# Patient Record
Sex: Male | Born: 1950 | Race: Black or African American | Hispanic: No | Smoking: Never smoker
Health system: Southern US, Community
[De-identification: ages and names within clinical notes are randomized; demographics above are authoritative.]

## PROBLEM LIST (undated history)

## (undated) DIAGNOSIS — E669 Obesity, unspecified: Secondary | ICD-10-CM

## (undated) DIAGNOSIS — C61 Malignant neoplasm of prostate: Secondary | ICD-10-CM

## (undated) DIAGNOSIS — I10 Essential (primary) hypertension: Secondary | ICD-10-CM

## (undated) HISTORY — DX: Obesity, unspecified: E66.9

## (undated) HISTORY — DX: Essential (primary) hypertension: I10

---

## 2012-01-16 ENCOUNTER — Other Ambulatory Visit: Payer: Self-pay | Admitting: Physician Assistant

## 2012-01-25 ENCOUNTER — Ambulatory Visit: Payer: Self-pay | Admitting: Physician Assistant

## 2012-01-25 DIAGNOSIS — E669 Obesity, unspecified: Secondary | ICD-10-CM

## 2012-01-25 DIAGNOSIS — R972 Elevated prostate specific antigen [PSA]: Secondary | ICD-10-CM

## 2012-01-25 DIAGNOSIS — E119 Type 2 diabetes mellitus without complications: Secondary | ICD-10-CM

## 2012-01-25 DIAGNOSIS — I10 Essential (primary) hypertension: Secondary | ICD-10-CM

## 2012-01-25 LAB — GLUCOSE, POCT (MANUAL RESULT ENTRY): POC Glucose: 187

## 2012-01-25 MED ORDER — METFORMIN HCL 1000 MG PO TABS: 1000.0000 mg | ORAL_TABLET | Freq: Two times a day (BID) | ORAL | Status: DC

## 2012-01-25 MED ORDER — FREESTYLE SYSTEM KIT: 1.0000 | PACK | Status: AC | PRN

## 2012-01-25 MED ORDER — GLIPIZIDE 10 MG PO TABS: 10.0000 mg | ORAL_TABLET | Freq: Two times a day (BID) | ORAL | Status: DC

## 2012-01-25 MED ORDER — GLUCOSE BLOOD VI STRP: ORAL_STRIP | Status: AC

## 2012-01-25 MED ORDER — LISINOPRIL 20 MG PO TABS: 20.0000 mg | ORAL_TABLET | Freq: Every day | ORAL | Status: DC

## 2012-01-25 MED ORDER — AMITRIPTYLINE HCL 100 MG PO TABS: 100.0000 mg | ORAL_TABLET | Freq: Every evening | ORAL | Status: DC | PRN

## 2012-01-25 NOTE — Progress Notes (Signed)
  Subjective:    Patient ID: Jerome Serrano, male    DOB: [birth year], 61 y.o.   MRN: [id number]  HPI Pt. Has seen a counsellor since last seeing me.  Taking amitryptiline helped him deal with life and depression.  Has worked a job with his wife until January 2013.  Now new job.  Feels now more at peace and more self-esteem from having a job separate from her. Was getting a lot of relief from foot pain with the amitryptilne.Current job has exercise facility.  Needs note to participate. Pt currently only taking 1 dose of metformin daily, and has been taking glucotrol 1x daily. With new job, he'll be getting insurance soon and is electing to address other health issues once he gets that started.   Review of Systems  All other systems reviewed and are negative.       Objective:   Physical Exam  Nursing note and vitals reviewed. Constitutional: He is oriented to person, place, and time. He appears well-developed and well-nourished.       obese  HENT:  Head: Normocephalic and atraumatic.  Neck: Normal range of motion. Neck supple. No thyromegaly present.  Cardiovascular: Normal rate, regular rhythm, normal heart sounds and intact distal pulses.  Exam reveals no gallop and no friction rub.   No murmur heard. Pulmonary/Chest: Effort normal and breath sounds normal.  Lymphadenopathy:    He has no cervical adenopathy.  Neurological: He is alert and oriented to person, place, and time.  Skin: Skin is warm and dry.  Psychiatric: He has a normal mood and affect. His behavior is normal. Judgment and thought content normal.      Results for orders placed in visit on 01/25/12  GLUCOSE, POCT (MANUAL RESULT ENTRY)      Component Value Range   POC Glucose 187    POCT GLYCOSYLATED HEMOGLOBIN (HGB A1C)      Component Value Range   Hemoglobin A1C 8.3         Assessment & Plan:  Anxiety/Depression-Continue elavil.  Also it has eradicated the foot pain he was having.  Diabetes-uncontrolled.  Dose changes on meds.  Rx new glucometer. Hypertension-uncontrolled for diabetic goal.  Increase Lisinopril Obesity-start gentle increase in physical activity as tolerated.

## 2012-01-26 LAB — PSA: PSA: 5.05 ng/mL — ABNORMAL HIGH (ref <=4.00)

## 2012-01-27 ENCOUNTER — Telehealth: Payer: Self-pay

## 2012-01-27 NOTE — Telephone Encounter (Signed)
Pt has questions about his medications at the [facility] [address] he only received his blood sugar meter he understood that there would be more prescriptions  Thanks Lupita Leash

## 2012-01-29 NOTE — Telephone Encounter (Signed)
LMOM to CB. 

## 2012-01-31 DIAGNOSIS — E119 Type 2 diabetes mellitus without complications: Secondary | ICD-10-CM | POA: Insufficient documentation

## 2012-01-31 DIAGNOSIS — I10 Essential (primary) hypertension: Secondary | ICD-10-CM | POA: Insufficient documentation

## 2012-01-31 DIAGNOSIS — E669 Obesity, unspecified: Secondary | ICD-10-CM | POA: Insufficient documentation

## 2012-01-31 NOTE — Telephone Encounter (Signed)
LMOM to CB if he still has ?s about his meds that were sent to pharmacy, or if pharmacy maybe found the other Rxs and problem had been resolved?

## 2012-01-31 NOTE — Progress Notes (Signed)
Addended by: Anders Simmonds on: 01/31/2012 05:19 PM   Modules accepted: Orders

## 2012-01-31 NOTE — Telephone Encounter (Signed)
SPoke with pt and he is going to check if the pharmacy has RXS now

## 2012-02-01 NOTE — Telephone Encounter (Signed)
Spoke with patient patient states that pharmacy has prescriptions.  Patient was not able to pick up meter because it is $134 and patient can not afford.  Patient would like a cheaper alternative...possibly [facility]. Please let patient know.

## 2012-02-01 NOTE — Telephone Encounter (Signed)
Called [facility] and spoke with pharmacy to give them OK to change meter/testing strips to brand of pt's choice. Called pt to notify him that he can go by and pharmacy will discuss different options and costs, and they have our authorization to change to meter of his choice. Pt agreed

## 2012-02-21 ENCOUNTER — Telehealth: Payer: Self-pay

## 2012-02-21 MED ORDER — AMITRIPTYLINE HCL 100 MG PO TABS: 100.0000 mg | ORAL_TABLET | Freq: Every evening | ORAL | Status: DC | PRN

## 2012-02-21 NOTE — Telephone Encounter (Signed)
amitriptyline

## 2012-02-21 NOTE — Telephone Encounter (Signed)
Pt states pharmacy does not have a rx for his amtriptomian and he has tried to get pharmacy to refill pt last seen by [employer]

## 2012-02-21 NOTE — Telephone Encounter (Signed)
Pt wants to know if we can do a 90d supply

## 2012-02-21 NOTE — Telephone Encounter (Signed)
Done and sent.  Jerome Serrano 

## 2012-02-22 MED ORDER — AMITRIPTYLINE HCL 100 MG PO TABS: 100.0000 mg | ORAL_TABLET | Freq: Every evening | ORAL | Status: DC | PRN

## 2012-02-22 NOTE — Telephone Encounter (Signed)
Rx sent to pharmacy for this patient yesterday stating he needs an OV, but he was just here 3/6.  Can we rx 90 day supply with refill?

## 2012-02-22 NOTE — Telephone Encounter (Signed)
Sent in a 90 supply.

## 2012-02-22 NOTE — Telephone Encounter (Signed)
Pt states that he has not had his medication for amitriptyline for 6 days. He states that the people around him are starting to notice that he has be extra grouchy.

## 2012-02-22 NOTE — Telephone Encounter (Signed)
Notified pt 90 day RF sent in. Pt thanked [place]

## 2012-05-18 ENCOUNTER — Other Ambulatory Visit: Payer: Self-pay | Admitting: Physician Assistant

## 2012-05-30 ENCOUNTER — Ambulatory Visit: Payer: Self-pay | Admitting: Physician Assistant

## 2012-06-08 ENCOUNTER — Encounter: Payer: Self-pay | Admitting: Physician Assistant

## 2012-06-08 ENCOUNTER — Ambulatory Visit (INDEPENDENT_AMBULATORY_CARE_PROVIDER_SITE_OTHER): Payer: BC Managed Care – PPO | Admitting: Physician Assistant

## 2012-06-08 VITALS — BP 175/96 | HR 88 | Temp 97.6°F | Resp 16 | Ht 67.0 in | Wt 293.0 lb

## 2012-06-08 DIAGNOSIS — E669 Obesity, unspecified: Secondary | ICD-10-CM

## 2012-06-08 DIAGNOSIS — E7800 Pure hypercholesterolemia, unspecified: Secondary | ICD-10-CM

## 2012-06-08 DIAGNOSIS — E119 Type 2 diabetes mellitus without complications: Secondary | ICD-10-CM

## 2012-06-08 DIAGNOSIS — I10 Essential (primary) hypertension: Secondary | ICD-10-CM

## 2012-06-08 LAB — COMPREHENSIVE METABOLIC PANEL
ALT: 54 U/L — ABNORMAL HIGH (ref 0–53)
AST: 34 U/L (ref 0–37)
Albumin: 4.1 g/dL (ref 3.5–5.2)
Alkaline Phosphatase: 89 U/L (ref 39–117)
BUN: 10 mg/dL (ref 6–23)
Potassium: 4 mEq/L (ref 3.5–5.3)

## 2012-06-08 LAB — LIPID PANEL
Cholesterol: 184 mg/dL (ref 0–200)
LDL Cholesterol: 109 mg/dL — ABNORMAL HIGH (ref 0–99)
Total CHOL/HDL Ratio: 3.1 Ratio
VLDL: 16 mg/dL (ref 0–40)

## 2012-06-08 LAB — CBC WITH DIFFERENTIAL/PLATELET
Eosinophils Absolute: 0.1 10*3/uL (ref 0.0–0.7)
Eosinophils Relative: 1 % (ref 0–5)
HCT: 44.1 % (ref 39.0–52.0)
Hemoglobin: 15 g/dL (ref 13.0–17.0)
Lymphs Abs: 1.6 10*3/uL (ref 0.7–4.0)
MCH: 28.3 pg (ref 26.0–34.0)
MCHC: 34 g/dL (ref 30.0–36.0)
MCV: 83.2 fL (ref 78.0–100.0)
Monocytes Absolute: 0.4 10*3/uL (ref 0.1–1.0)
Monocytes Relative: 7 % (ref 3–12)
Neutrophils Relative %: 67 % (ref 43–77)
RBC: 5.3 MIL/uL (ref 4.22–5.81)

## 2012-06-08 MED ORDER — GLIPIZIDE 10 MG PO TABS: 10.0000 mg | ORAL_TABLET | Freq: Two times a day (BID) | ORAL | Status: DC

## 2012-06-08 MED ORDER — AMITRIPTYLINE HCL 100 MG PO TABS: 100.0000 mg | ORAL_TABLET | Freq: Every evening | ORAL | Status: DC | PRN

## 2012-06-08 MED ORDER — LISINOPRIL-HYDROCHLOROTHIAZIDE 20-12.5 MG PO TABS: 1.0000 | ORAL_TABLET | Freq: Every day | ORAL | Status: DC

## 2012-06-08 MED ORDER — METFORMIN HCL 1000 MG PO TABS: 1000.0000 mg | ORAL_TABLET | Freq: Two times a day (BID) | ORAL | Status: DC

## 2012-06-08 NOTE — Patient Instructions (Addendum)
Check blood pressure 3-4 times per week and record to bring to me in 1 month at f/up Exercise at least 5 minutes everyday. Drink more water. Cut out sugar and white carbohydrates.

## 2012-06-08 NOTE — Progress Notes (Signed)
  Subjective:    Patient ID: Jerome Serrano, male    DOB: 06-04-1951, 61 y.o.   MRN: [id number]  HPI Jerome Serrano is here today for a diabetes and blood pressure check up.  He admits to poor diet including bakery goods  Last night he ate very salty [place] food.  His BP out of office run ~150/80-90. His feet are no longer causing him problems since starting the elavil. His sugars are running in the low 200s.  He says he is compliant with his medications.  He is scheduled for a prostate biopsy in September.  Review of Systems  All other systems reviewed and are negative.       Objective:   Physical Exam  Nursing note and vitals reviewed. Constitutional: He is oriented to person, place, and time. He appears well-developed and well-nourished.  Cardiovascular: Normal rate, regular rhythm, normal heart sounds and intact distal pulses.   Pulmonary/Chest: Effort normal and breath sounds normal.  Musculoskeletal:       B feet without sores and sensory is intact.  Neurological: He is alert and oriented to person, place, and time.  Skin: Skin is warm and dry.   Results for orders placed in visit on 06/08/12  POCT GLYCOSYLATED HEMOGLOBIN (HGB A1C)      Component Value Range   Hemoglobin A1C 9.1          Assessment & Plan:  Hypertension-not controlled.  Change lisinopril to lisinopril/HCT.  Decrease salt intake.   Diabetes-uncontrolled.  A1C worse at this visit, but patient is insistent that he will give up sugar and begin exercising and he will not discuss insulin at this point.  Keep meds same for now. Paresthesias-feet-improved on elavil. Continue elavil. Sending to nutrition. Discussed lifestyle and nutrition choices at length.  Patient has agreed to the instructions I printed for him and is to return in 1 month.  If not making progress, will discuss insulin.

## 2012-06-21 ENCOUNTER — Encounter: Payer: BC Managed Care – PPO | Attending: Physician Assistant | Admitting: *Deleted

## 2012-06-21 ENCOUNTER — Encounter: Payer: Self-pay | Admitting: *Deleted

## 2012-06-21 DIAGNOSIS — E119 Type 2 diabetes mellitus without complications: Secondary | ICD-10-CM | POA: Insufficient documentation

## 2012-06-21 DIAGNOSIS — Z713 Dietary counseling and surveillance: Secondary | ICD-10-CM | POA: Insufficient documentation

## 2012-06-21 NOTE — Progress Notes (Signed)
  Medical Nutrition Therapy:  Appt start time: 1630 end time:  1730.  Assessment:  Primary concerns today: patient here for diabetes education and assistance with obesity. He reads an e-mail newsletter "Diabetes Today" every day.  He states history of diabetes for about 20 years, tests his BG twice a day or more, and received diabetes education about 10 years ago.  Current A1c = 9.1% on 06/08/2012 MEDICATIONS: see list. Diabetes medication include Metformin and Glipizide   DIETARY INTAKE:  Usual eating pattern includes 3 meals and 0-1 snacks per day.  Everyday foods include good variety of all food grous.  Avoided foods include sweets and high fat foods.    24-hr recall:  B ( AM): just resumed eating breakfast: 8 oz non-fat fruited yogurt with granola, coffee in winter time only Still skips a couple of days a week. Snk ( AM): none  L ( PM): 1/2 of a large salad OR grilled chicken with cole slaw, OR left overs, SF lemonade or water Snk ( PM): none D ( PM): lean meat, starch, vegetables or salad,  Snk ( PM): animal crackers x 2-3 Beverages: water or SF lemonade  Usual physical activity: taking stairs more often  Estimated energy needs: 1600 calories 180 g carbohydrates 120 g protein 44 g fat  Progress Towards Goal(s):  In progress.   Nutritional Diagnosis:  NB-1.1 Food and nutrition-related knowledge deficit As related to diabetes management.  As evidenced by A1c of 9.1% on 06/08/12.    Intervention:  Nutrition counseling and diabetes education initiated. Discussed basic physiology of diabetes, SMBG and rationale of checking BG at alternate times of day, A1c, Carb Counting and reading food labels, and benefits of increased activity.   Plan: Aim for 3-4 Carb Choices (45-60 grams) per meal, 0-2 Carbs per snack if hungry Read food labels for total carbohydrate of foods eaten Consider APP for phone called [employer] to obtain nutrient content of foods easily Continue checking BG as  directed by MD, continue checking pre and post meals Consider being more active for 10-15 minutes on a daily basis  Handouts given during visit include: Living Well with Diabetes Carb Counting and Food Label handouts Meal Plan Card  Monitoring/Evaluation:  Dietary intake, exercise, reading food labels, and body weight prn.

## 2012-06-29 ENCOUNTER — Encounter: Payer: Self-pay | Admitting: *Deleted

## 2012-06-29 NOTE — Patient Instructions (Signed)
Plan: Aim for 3-4 Carb Choices (45-60 grams) per meal, 0-2 Carbs per snack if hungry Read food labels for total carbohydrate of foods eaten Consider APP for phone called [employer] to obtain nutrient content of foods easily Continue checking BG as directed by MD, continue checking pre and post meals Consider being more active for 10-15 minutes on a daily basis

## 2012-07-04 ENCOUNTER — Ambulatory Visit (INDEPENDENT_AMBULATORY_CARE_PROVIDER_SITE_OTHER): Payer: BC Managed Care – PPO | Admitting: Physician Assistant

## 2012-07-04 VITALS — BP 162/97 | HR 95 | Temp 97.0°F | Resp 16 | Ht 66.5 in | Wt 285.0 lb

## 2012-07-04 DIAGNOSIS — I10 Essential (primary) hypertension: Secondary | ICD-10-CM

## 2012-07-04 DIAGNOSIS — E669 Obesity, unspecified: Secondary | ICD-10-CM

## 2012-07-04 DIAGNOSIS — E119 Type 2 diabetes mellitus without complications: Secondary | ICD-10-CM

## 2012-07-04 LAB — GLUCOSE, POCT (MANUAL RESULT ENTRY): POC Glucose: 192 mg/dl — AB (ref 70–99)

## 2012-07-04 LAB — BASIC METABOLIC PANEL
CO2: 26 mEq/L (ref 19–32)
Calcium: 9.3 mg/dL (ref 8.4–10.5)
Creat: 1.05 mg/dL (ref 0.50–1.35)
Glucose, Bld: 179 mg/dL — ABNORMAL HIGH (ref 70–99)

## 2012-07-04 MED ORDER — HYDROCHLOROTHIAZIDE 25 MG PO TABS: 25.0000 mg | ORAL_TABLET | Freq: Every day | ORAL | Status: DC

## 2012-07-04 MED ORDER — LISINOPRIL 40 MG PO TABS: 40.0000 mg | ORAL_TABLET | Freq: Every day | ORAL | Status: AC

## 2012-07-04 NOTE — Progress Notes (Signed)
  Subjective:    Patient ID: Jerome Serrano, male    DOB: Apr 23, 1951, 61 y.o.   MRN: [id number]  HPI patient here for a recheck of his diabetes and hypertension.  He brought his BP cuff with him and it is in line with the numbers we are getting in office. However, as we scrolled through his BPs over the last month, they have averaged ~140/90 outside of the office!!  He went to the nutritionist and has been doing 5 minutes of exercise daily.  He has lost 8 pounds in 1 month!  Review of Systems  All other systems reviewed and are negative.       Objective:   Physical Exam  Nursing note and vitals reviewed. Constitutional: He is oriented to person, place, and time. He appears well-developed and well-nourished.  HENT:  Head: Normocephalic and atraumatic.  Cardiovascular: Normal rate, regular rhythm, normal heart sounds and intact distal pulses.   Pulmonary/Chest: Effort normal and breath sounds normal.  Musculoskeletal: He exhibits no edema.  Neurological: He is alert and oriented to person, place, and time.  Skin: Skin is warm and dry.     Results for orders placed in visit on 07/04/12  GLUCOSE, POCT (MANUAL RESULT ENTRY)      Component Value Range   POC Glucose 192 (*) 70 - 99 mg/dl        Assessment & Plan:  Diabetes-improving.  Patient is making progress and is determined to not take insulin, so as long as he is making progress and his fasting BG is coming down, we will hold off.  He is going to fax me his morning fasting BS weekly. Hypertension-not controlled with the addition of HCTZ.  Increasing Lisinopril to 40mg  and HCTZ to 25mg .  1 daily of each.  Recheck in 2 months and bring cuff so I can review BP numbers. I do think he has a component of white coat htn as his outside numbers are much more reasonable.

## 2012-07-05 ENCOUNTER — Encounter: Payer: Self-pay | Admitting: Physician Assistant

## 2012-07-20 ENCOUNTER — Other Ambulatory Visit: Payer: Self-pay | Admitting: Physician Assistant

## 2012-07-21 ENCOUNTER — Telehealth: Payer: Self-pay

## 2012-07-21 NOTE — Telephone Encounter (Signed)
PT WOULD LIKE TO KNOW IF THE HCTZ THAT HE WAS PRESCRIBED IS SUPPOSED TO REPLACE THE CURRENT HIGH BLOOD PRESSURE MEDICATION THAT HE IS ON, OR IS HE SUPPOSE TO TAKE THEM TOGETHER. BEST# [phone]

## 2012-07-21 NOTE — Telephone Encounter (Signed)
LMOM advising patient that he is supposed to continue the lisinopril and the hctz daily for his htn.  CB if ?'s.

## 2012-08-20 ENCOUNTER — Other Ambulatory Visit: Payer: Self-pay | Admitting: Physician Assistant

## 2012-10-26 ENCOUNTER — Other Ambulatory Visit: Payer: Self-pay | Admitting: Physician Assistant

## 2012-11-02 ENCOUNTER — Telehealth: Payer: Self-pay

## 2012-11-02 NOTE — Telephone Encounter (Signed)
Will forward to [place]. As I do not see that he was seen here recently.

## 2012-11-02 NOTE — Telephone Encounter (Signed)
Jerome Serrano: Patient calling saying he needs a work note for December 2 - December 6 for work because he was out the entire week. Please call him at [phone]

## 2012-11-06 NOTE — Telephone Encounter (Signed)
Tell him I am sorry!  I am unable to do this because I can't document medical necessity since he wasn't seen in the office and evaluated.

## 2012-11-07 NOTE — Telephone Encounter (Signed)
Called patient to advise  °

## 2012-12-04 ENCOUNTER — Telehealth: Payer: Self-pay

## 2012-12-04 MED ORDER — METFORMIN HCL 1000 MG PO TABS: 1000.0000 mg | ORAL_TABLET | Freq: Two times a day (BID) | ORAL | Status: DC

## 2012-12-04 MED ORDER — GLIPIZIDE 10 MG PO TABS: 10.0000 mg | ORAL_TABLET | Freq: Two times a day (BID) | ORAL | Status: DC

## 2012-12-04 NOTE — Telephone Encounter (Signed)
Sent in 1 mo supply patient advised. He is advised he needs to follow up before these run out.

## 2012-12-04 NOTE — Telephone Encounter (Signed)
PT STATES THE PHARMACY HAVE BEEN TRYING TO REFILL HIS METFORMIN AND GLUCOTROL AND THEY HAVEN'T HEARD FROM ANYONE, IS IN DIRE NEED OF HIS MEDS PLEASE CALL [phone] OR HIS CELL AT [phone]     [facility]

## 2013-01-09 ENCOUNTER — Encounter: Payer: Self-pay | Admitting: Physician Assistant

## 2013-01-09 ENCOUNTER — Ambulatory Visit (INDEPENDENT_AMBULATORY_CARE_PROVIDER_SITE_OTHER): Payer: BC Managed Care – PPO | Admitting: Physician Assistant

## 2013-01-09 ENCOUNTER — Telehealth: Payer: Self-pay

## 2013-01-09 VITALS — BP 146/90 | HR 97 | Temp 98.5°F | Resp 16 | Ht 67.0 in | Wt 277.6 lb

## 2013-01-09 DIAGNOSIS — E785 Hyperlipidemia, unspecified: Secondary | ICD-10-CM

## 2013-01-09 DIAGNOSIS — E1165 Type 2 diabetes mellitus with hyperglycemia: Secondary | ICD-10-CM

## 2013-01-09 LAB — LIPID PANEL
HDL: 67 mg/dL (ref >39)
Total CHOL/HDL Ratio: 3.7 Ratio
Triglycerides: 193 mg/dL — ABNORMAL HIGH (ref <150)

## 2013-01-09 LAB — COMPREHENSIVE METABOLIC PANEL
Albumin: 4 g/dL (ref 3.5–5.2)
CO2: 26 mEq/L (ref 19–32)
Glucose, Bld: 294 mg/dL — ABNORMAL HIGH (ref 70–99)
Potassium: 4.1 mEq/L (ref 3.5–5.3)
Sodium: 134 mEq/L — ABNORMAL LOW (ref 135–145)
Total Protein: 7.4 g/dL (ref 6.0–8.3)

## 2013-01-09 MED ORDER — AMITRIPTYLINE HCL 100 MG PO TABS: 100.0000 mg | ORAL_TABLET | Freq: Every evening | ORAL | Status: DC | PRN

## 2013-01-09 MED ORDER — INSULIN GLARGINE 100 UNIT/ML ~~LOC~~ SOLN
15.0000 [IU] | Freq: Every day | SUBCUTANEOUS | Status: DC
Start: 2013-01-09 — End: 2013-01-11

## 2013-01-09 MED ORDER — METFORMIN HCL 1000 MG PO TABS: 1000.0000 mg | ORAL_TABLET | Freq: Two times a day (BID) | ORAL | Status: DC

## 2013-01-09 MED ORDER — GLUCOSE BLOOD VI STRP: ORAL_STRIP | Status: DC

## 2013-01-09 MED ORDER — GLIPIZIDE 10 MG PO TABS: 10.0000 mg | ORAL_TABLET | Freq: Two times a day (BID) | ORAL | Status: DC

## 2013-01-09 MED ORDER — HYDROCHLOROTHIAZIDE 25 MG PO TABS: 25.0000 mg | ORAL_TABLET | Freq: Every day | ORAL | Status: DC

## 2013-01-09 NOTE — Telephone Encounter (Signed)
Did you want him to have a lantus pen, pended this please advise so we can resend to pharmacy

## 2013-01-09 NOTE — Telephone Encounter (Signed)
Pt states that him and [place] discussed him getting an insulin pen today during his appt, pt is currently at the pharmacy and he states that they gave him a vial of insulin. ZOXW#[phone]  Pharmacy:[facility]

## 2013-01-09 NOTE — Progress Notes (Signed)
  Subjective:    Patient ID: Jerome Serrano, male    DOB: 06/20/1951, 62 y.o.   MRN: [id number]  HPI 62 yr old AAM here for diabetes/htn recheck.  He was supposed to see me in September. He has bee +/- on his dietary changes since seeing the nutritionist in 06/2012.  He has lost 8 pounds since his last visit with me.  His sugars have been ranging from usu ~160, but he has had 1 reading that was 400 recently.  He says he is compliant with his meds about 85-90% of the time. As far as injectable diabetes meds, he was on byetta at one point, so he is familiar with injections.  He still has not rescheduled his prostate biopsy!!!! Review of Systems  All other systems reviewed and are negative.       Objective:   Physical Exam  Nursing note and vitals reviewed. Constitutional: He is oriented to person, place, and time. He appears well-developed and well-nourished.  HENT:  Head: Normocephalic and atraumatic.  Cardiovascular: Normal rate, regular rhythm and normal heart sounds.   Pulmonary/Chest: Effort normal and breath sounds normal.  Neurological: He is alert and oriented to person, place, and time.  Skin: Skin is warm and dry.  Psychiatric: He has a normal mood and affect. His behavior is normal.     Results for orders placed in visit on 01/09/13  POCT GLYCOSYLATED HEMOGLOBIN (HGB A1C)      Result Value Range   Hemoglobin A1C 11.4          Assessment & Plan:  Diabetes-not controlled.  His last A1C was ~9 in august 2013.  We again discussed continued, more aggressive lifestyle changes.  In the mean time, I am going to have him go ahead and see an endocrinologist to help with diabetes management.  Until we can get that appointment, I am starting him on 15 units of lantus at night and he will continue his current meds and watch diet more closely. Hypertension-sub optimal control, but much better than last visit and will continue to improve with weight loss.  No changes for  now. Hypercholesterolemia-checking labs. Elevated PSA-we referred him and he saw the urologist and they scheduled him for a biopsy, but he didn't keep the appointment.  He assures me again that he will reschedule this.  I told him it is imperative and discussed the R/B and possible repercussions if he continues to put it off.

## 2013-01-10 LAB — CBC WITH DIFFERENTIAL/PLATELET
Basophils Absolute: 0 10*3/uL (ref 0.0–0.1)
Eosinophils Absolute: 0.1 10*3/uL (ref 0.0–0.7)
Lymphocytes Relative: 30 % (ref 12–46)
Lymphs Abs: 1.8 10*3/uL (ref 0.7–4.0)
MCH: 28 pg (ref 26.0–34.0)
Neutrophils Relative %: 63 % (ref 43–77)
Platelets: 260 10*3/uL (ref 150–400)
RBC: 5.33 MIL/uL (ref 4.22–5.81)
WBC: 5.9 10*3/uL (ref 4.0–10.5)

## 2013-01-10 LAB — MICROALBUMIN, URINE: Microalb, Ur: 0.9 mg/dL (ref 0.00–1.89)

## 2013-01-11 MED ORDER — INSULIN PEN NEEDLE 30G X 8 MM MISC: Status: DC

## 2013-01-11 MED ORDER — INSULIN GLARGINE 100 UNIT/ML ~~LOC~~ SOLN: 15.0000 [IU] | Freq: Every day | SUBCUTANEOUS | Status: DC

## 2013-01-11 NOTE — Telephone Encounter (Signed)
Sent in the pen and pen needles. Called patient to advise

## 2013-01-11 NOTE — Telephone Encounter (Signed)
It should be the pen

## 2013-01-13 ENCOUNTER — Telehealth: Payer: Self-pay

## 2013-01-13 NOTE — Telephone Encounter (Signed)
Pt doesn't know who just called but would like for someone to call him back

## 2013-01-14 NOTE — Telephone Encounter (Signed)
Jerome Serrano spoke with him about his labs on that day.

## 2013-11-11 ENCOUNTER — Other Ambulatory Visit: Payer: Self-pay | Admitting: Physician Assistant

## 2013-12-10 ENCOUNTER — Institutional Professional Consult (permissible substitution): Payer: BC Managed Care – PPO | Admitting: Radiation Oncology

## 2014-02-07 ENCOUNTER — Other Ambulatory Visit: Payer: Self-pay | Admitting: Physician Assistant

## 2014-04-18 ENCOUNTER — Other Ambulatory Visit: Payer: Self-pay | Admitting: Family Medicine

## 2014-04-18 ENCOUNTER — Ambulatory Visit
Admission: RE | Admit: 2014-04-18 | Discharge: 2014-04-18 | Disposition: A | Discharge status: Home / Self Care | Payer: BC Managed Care – PPO | Source: Ambulatory Visit | Attending: Family Medicine | Admitting: Family Medicine

## 2014-04-18 DIAGNOSIS — R52 Pain, unspecified: Secondary | ICD-10-CM

## 2014-05-17 ENCOUNTER — Other Ambulatory Visit: Payer: Self-pay | Admitting: Physician Assistant

## 2014-10-06 ENCOUNTER — Encounter: Payer: Self-pay | Admitting: Radiation Oncology

## 2014-10-06 NOTE — Progress Notes (Signed)
GU Location of Tumor / Histology: prostate cancer    Jerome Serrano presented with rising PSA.    PSA history:    Biopsies revealed:  09/16/14   Past/Anticipated interventions by urology, if any: prostate biopsy 10/27  Past/Anticipated interventions by medical oncology, if any:   Weight changes, if any:   Bowel/Bladder complaints, if any:    Nausea/Vomiting, if any:   Pain issues, if any:    SAFETY ISSUES:  Prior radiation?   Pacemaker/ICD?   Possible current pregnancy? no  Is the patient on methotrexate? no  Current Complaints / other details:

## 2014-10-08 ENCOUNTER — Ambulatory Visit: Payer: BC Managed Care – PPO

## 2014-10-08 ENCOUNTER — Ambulatory Visit
Admission: RE | Admit: 2014-10-08 | Discharge: 2014-10-08 | Disposition: A | Discharge status: Home / Self Care | Payer: BC Managed Care – PPO | Source: Ambulatory Visit | Attending: Radiation Oncology | Admitting: Radiation Oncology

## 2014-10-08 HISTORY — DX: Malignant neoplasm of prostate: C61

## 2014-10-21 HISTORY — PX: PROSTATE BIOPSY: SHX241

## 2014-10-29 ENCOUNTER — Ambulatory Visit
Admission: RE | Admit: 2014-10-29 | Discharge: 2014-10-29 | Disposition: A | Discharge status: Home / Self Care | Payer: BC Managed Care – PPO | Source: Ambulatory Visit | Attending: Radiation Oncology | Admitting: Radiation Oncology

## 2014-10-29 ENCOUNTER — Ambulatory Visit: Payer: BC Managed Care – PPO

## 2014-12-08 NOTE — Progress Notes (Addendum)
GU Location of Tumor / Histology: prostate cancer    Darolyn Rua presented with rising PSA.   PSA history:    Biopsies revealed:  09/16/14   Past/Anticipated interventions by urology, if any: prostate biopsy 10/27  Past/Anticipated interventions by medical oncology, if any: no  Weight changes, if any: has lost 7 lbs due to taking Victoza.  Bowel/Bladder complaints, if any: Reports occasionally diarrhea from changing diabetes medications.  IPSS score of 1 with getting up once per night to urinate.  Nausea/Vomiting, if any: no  Pain issues, if any: no  SAFETY ISSUES:  Prior radiation? no  Pacemaker/ICD? no  Possible current pregnancy? no  Is the patient on methotrexate? no  Current Complaints / other details: IPSS score of 1.        BP 157/74 mmHg  Pulse 71  Temp(Src) 97.7 F (36.5 C) (Oral)  Resp 12  Ht 5' 8.5" (1.74 m)  Wt 286 lb 1.6 oz (129.774 kg)  BMI 42.86 kg/m2

## 2014-12-10 ENCOUNTER — Encounter: Payer: Self-pay | Admitting: Radiation Oncology

## 2014-12-10 ENCOUNTER — Ambulatory Visit
Admission: RE | Admit: 2014-12-10 | Discharge: 2014-12-10 | Disposition: A | Discharge status: Home / Self Care | Payer: BLUE CROSS/BLUE SHIELD | Source: Ambulatory Visit | Attending: Radiation Oncology | Admitting: Radiation Oncology

## 2014-12-10 VITALS — BP 157/74 | HR 71 | Temp 97.7°F | Resp 12 | Ht 68.5 in | Wt 286.1 lb

## 2014-12-10 DIAGNOSIS — I10 Essential (primary) hypertension: Secondary | ICD-10-CM | POA: Insufficient documentation

## 2014-12-10 DIAGNOSIS — E119 Type 2 diabetes mellitus without complications: Secondary | ICD-10-CM | POA: Diagnosis not present

## 2014-12-10 DIAGNOSIS — Z7982 Long term (current) use of aspirin: Secondary | ICD-10-CM | POA: Diagnosis not present

## 2014-12-10 DIAGNOSIS — E669 Obesity, unspecified: Secondary | ICD-10-CM | POA: Insufficient documentation

## 2014-12-10 DIAGNOSIS — Z794 Long term (current) use of insulin: Secondary | ICD-10-CM | POA: Diagnosis not present

## 2014-12-10 DIAGNOSIS — C61 Malignant neoplasm of prostate: Secondary | ICD-10-CM

## 2014-12-10 NOTE — Progress Notes (Signed)
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 male who is seen out courtesy of Dr. Louis Meckel for an opinion concerning radiation therapy for management of patient's recently diagnosed early stage prostate cancer. Patient in past years has been noted to have a mildly elevated PSA. In March 2013 the patient's PSA was 5.05. It was recommended the he proceed with biopsy but the patient deferred. He was seen again in the fall of 2015 and PSA had risen further to 7.10. Patient decided to proceed with further evaluation. He was seen by Dr. Louis Meckel and on 09/16/2014 the patient underwent transrectal ultrasound and biopsy of the prostate. Prostate volume at that time was 29.5 cm. 5 of 12 biopsies revealed Gleason's 6 prostate cancer (3+3). Low volume disease noted in the biopsies  with the highest involvement in the right mid gland being 20% of one core. The options for management were discussed in detail with the patient by urology. The patient is interested in discussing his radiation therapy options and is now seen concerning this issue.Marland Kitchen  PREVIOUS RADIATION THERAPY: No  PAST MEDICAL HISTORY:  has a past medical history of Diabetes mellitus; Hypertension; Obesity; and Prostate cancer.    PAST SURGICAL HISTORY: Past Surgical History  Procedure Laterality Date  . Prostate biopsy  12/15    FAMILY HISTORY: family history includes Breast cancer in his mother; Colon cancer in his father; Diabetes in his brother.  SOCIAL HISTORY:  reports that he has never smoked. He has never used smokeless tobacco. He reports that he does not drink alcohol.    ALLERGIES: Lactose intolerance (gi)  MEDICATIONS:  Current Outpatient Prescriptions  Medication Sig Dispense Refill  . Amlodipine-Olmesartan (AZOR PO) Take by mouth daily.    Marland Kitchen aspirin 81 MG tablet Take 81 mg by mouth daily.    . Cholecalciferol (VITAMIN D-3 PO) Take by mouth daily.    . Dulaglutide 1.5 MG/0.5ML SOPN Inject into the skin once a week.    . empagliflozin (JARDIANCE) 10 MG TABS tablet Take 10 mg by mouth daily.    Marland Kitchen glucose blood test strip Use as instructed 100 each 12  . Insulin Glargine (TOUJEO SOLOSTAR [place]) Inject 15 Units into the skin daily.    . Insulin Pen Needle (NOVOFINE) 30G X 8 MM MISC For use with lantus pens 100 each 1  . metFORMIN (GLUCOPHAGE) 1000 MG tablet Take 1 tablet (1,000 mg total) by mouth 2 (two) times daily with a meal. PATIENT NEEDS OFFICE VISIT FOR ADDITIONAL REFILLS (Patient taking differently: Take 1,000 mg by mouth daily with breakfast. PATIENT NEEDS OFFICE VISIT FOR ADDITIONAL REFILLS) 60 tablet 0  . LANTUS SOLOSTAR 100 UNIT/ML Solostar Pen INJECT 15 UNITS INTO THE SKIN AT BEDTIME (Patient not taking: Reported on 12/10/2014) 1 pen 0   No current facility-administered medications for this encounter.    REVIEW OF SYSTEMS:  A 15 point review of systems is documented in the electronic medical record. This was obtained by the nursing staff. However, I reviewed this with the patient to discuss relevant findings and make appropriate changes.  Patient denies any new bony pain headaches dizziness or blurred vision. He completed the international  prostate symptom score with total value of 2. The patient's only significant scores was nocturia. Patient does have erectile dysfunction which is marginally helped with Cialis. The patient admits to being quite anxious and upset concerning his diagnosis of cancer. He remembers that his mother died of metastatic breast cancer and his father died of complications related to colon cancer. Patient's brother also has a  diagnosis of prostate cancer.   PHYSICAL EXAM:  height is 5' 8.5" (1.74 m) and weight is 286 lb 1.6 oz (129.774 kg). His oral temperature is 97.7 F (36.5 C). His blood pressure is 157/74 and his pulse is 71. His respiration is 12.   A detailed examination was not performed today at the patient's request  ECOG = 0  0 - Asymptomatic (Fully active, able to carry on all predisease activities without restriction)  LABORATORY DATA:  Lab Results  Component Value Date   WBC 5.9 01/09/2013   HGB 14.9 01/09/2013   HCT 44.5 01/09/2013   MCV 83.5 01/09/2013   PLT 260 01/09/2013   NEUTROABS 3.7 01/09/2013   Lab Results  Component Value Date   NA 134* 01/09/2013   K 4.1 01/09/2013   CL 98 01/09/2013   CO2 26 01/09/2013   GLUCOSE 294* 01/09/2013   CREATININE 1.14 01/09/2013   CALCIUM 9.6 01/09/2013      RADIOGRAPHY: No results found.    IMPRESSION: Stage TIc Gleason's 6 (3+3) adenocarcinoma the prostate, PSA 7.1 with moderate volume disease. I discussed options for management including watchful waiting, radical prostatectomy seed implantation and external beam radiation therapy. I discussed the pros and cons of each of these treatment approaches. Given the patient's relatively young age and extent of disease involvement I was less hesitant in recommending watchful waiting. Patient is also not comfortable with this treatment approach.  He does wish to proceed with definitive/curative treatment, however the patient would like some time to make his decision.  PLAN: Once the patient has made his decision concerning definitive management he will contact urology or radiation oncology for initiation of this treatment.  He will call for questions in the meantime.      ------------------------------------------------  Blair Promise, PhD, MD

## 2014-12-10 NOTE — Progress Notes (Signed)
Please see the Nurse Progress Note in the MD Initial Consult Encounter for this patient. 

## 2016-10-18 DIAGNOSIS — C61 Malignant neoplasm of prostate: Secondary | ICD-10-CM | POA: Diagnosis not present

## 2016-10-18 DIAGNOSIS — I10 Essential (primary) hypertension: Secondary | ICD-10-CM | POA: Diagnosis not present

## 2016-10-18 DIAGNOSIS — E119 Type 2 diabetes mellitus without complications: Secondary | ICD-10-CM | POA: Diagnosis not present

## 2016-10-18 DIAGNOSIS — E6609 Other obesity due to excess calories: Secondary | ICD-10-CM | POA: Diagnosis not present

## 2016-11-08 DIAGNOSIS — R972 Elevated prostate specific antigen [PSA]: Secondary | ICD-10-CM | POA: Diagnosis not present

## 2016-11-08 DIAGNOSIS — E6609 Other obesity due to excess calories: Secondary | ICD-10-CM | POA: Diagnosis not present

## 2016-11-08 DIAGNOSIS — E119 Type 2 diabetes mellitus without complications: Secondary | ICD-10-CM | POA: Diagnosis not present

## 2016-11-08 DIAGNOSIS — I10 Essential (primary) hypertension: Secondary | ICD-10-CM | POA: Diagnosis not present

## 2016-12-09 DIAGNOSIS — E119 Type 2 diabetes mellitus without complications: Secondary | ICD-10-CM | POA: Diagnosis not present

## 2016-12-09 DIAGNOSIS — I10 Essential (primary) hypertension: Secondary | ICD-10-CM | POA: Diagnosis not present

## 2016-12-09 DIAGNOSIS — E6609 Other obesity due to excess calories: Secondary | ICD-10-CM | POA: Diagnosis not present

## 2016-12-09 DIAGNOSIS — C61 Malignant neoplasm of prostate: Secondary | ICD-10-CM | POA: Diagnosis not present

## 2017-02-06 DIAGNOSIS — E6609 Other obesity due to excess calories: Secondary | ICD-10-CM | POA: Diagnosis not present

## 2017-02-06 DIAGNOSIS — I10 Essential (primary) hypertension: Secondary | ICD-10-CM | POA: Diagnosis not present

## 2017-02-06 DIAGNOSIS — C61 Malignant neoplasm of prostate: Secondary | ICD-10-CM | POA: Diagnosis not present

## 2017-02-06 DIAGNOSIS — E119 Type 2 diabetes mellitus without complications: Secondary | ICD-10-CM | POA: Diagnosis not present

## 2017-03-20 DIAGNOSIS — E119 Type 2 diabetes mellitus without complications: Secondary | ICD-10-CM | POA: Diagnosis not present

## 2017-03-20 DIAGNOSIS — E6609 Other obesity due to excess calories: Secondary | ICD-10-CM | POA: Diagnosis not present

## 2017-03-20 DIAGNOSIS — C61 Malignant neoplasm of prostate: Secondary | ICD-10-CM | POA: Diagnosis not present

## 2017-03-20 DIAGNOSIS — I10 Essential (primary) hypertension: Secondary | ICD-10-CM | POA: Diagnosis not present

## 2017-05-19 DIAGNOSIS — E119 Type 2 diabetes mellitus without complications: Secondary | ICD-10-CM | POA: Diagnosis not present

## 2017-05-19 DIAGNOSIS — C61 Malignant neoplasm of prostate: Secondary | ICD-10-CM | POA: Diagnosis not present

## 2017-05-19 DIAGNOSIS — E6609 Other obesity due to excess calories: Secondary | ICD-10-CM | POA: Diagnosis not present

## 2017-05-19 DIAGNOSIS — I10 Essential (primary) hypertension: Secondary | ICD-10-CM | POA: Diagnosis not present

## 2017-06-23 DIAGNOSIS — E119 Type 2 diabetes mellitus without complications: Secondary | ICD-10-CM | POA: Diagnosis not present

## 2017-06-23 DIAGNOSIS — E6609 Other obesity due to excess calories: Secondary | ICD-10-CM | POA: Diagnosis not present

## 2017-06-23 DIAGNOSIS — I10 Essential (primary) hypertension: Secondary | ICD-10-CM | POA: Diagnosis not present

## 2017-08-25 DIAGNOSIS — E6609 Other obesity due to excess calories: Secondary | ICD-10-CM | POA: Diagnosis not present

## 2017-08-25 DIAGNOSIS — E119 Type 2 diabetes mellitus without complications: Secondary | ICD-10-CM | POA: Diagnosis not present

## 2017-08-25 DIAGNOSIS — I10 Essential (primary) hypertension: Secondary | ICD-10-CM | POA: Diagnosis not present

## 2017-11-03 DIAGNOSIS — E119 Type 2 diabetes mellitus without complications: Secondary | ICD-10-CM | POA: Diagnosis not present

## 2017-11-03 DIAGNOSIS — I10 Essential (primary) hypertension: Secondary | ICD-10-CM | POA: Diagnosis not present

## 2017-11-06 DIAGNOSIS — E119 Type 2 diabetes mellitus without complications: Secondary | ICD-10-CM | POA: Diagnosis not present

## 2017-11-06 DIAGNOSIS — H40033 Anatomical narrow angle, bilateral: Secondary | ICD-10-CM | POA: Diagnosis not present

## 2018-01-26 DIAGNOSIS — E119 Type 2 diabetes mellitus without complications: Secondary | ICD-10-CM | POA: Diagnosis not present

## 2018-01-26 DIAGNOSIS — M10072 Idiopathic gout, left ankle and foot: Secondary | ICD-10-CM | POA: Diagnosis not present

## 2018-01-26 DIAGNOSIS — I10 Essential (primary) hypertension: Secondary | ICD-10-CM | POA: Diagnosis not present

## 2018-01-26 DIAGNOSIS — E6609 Other obesity due to excess calories: Secondary | ICD-10-CM | POA: Diagnosis not present

## 2018-02-06 DIAGNOSIS — I10 Essential (primary) hypertension: Secondary | ICD-10-CM | POA: Diagnosis not present

## 2018-02-06 DIAGNOSIS — E119 Type 2 diabetes mellitus without complications: Secondary | ICD-10-CM | POA: Diagnosis not present

## 2018-02-06 DIAGNOSIS — M10072 Idiopathic gout, left ankle and foot: Secondary | ICD-10-CM | POA: Diagnosis not present

## 2018-02-06 DIAGNOSIS — E6609 Other obesity due to excess calories: Secondary | ICD-10-CM | POA: Diagnosis not present

## 2018-02-06 DIAGNOSIS — E1165 Type 2 diabetes mellitus with hyperglycemia: Secondary | ICD-10-CM | POA: Diagnosis not present

## 2018-02-20 DIAGNOSIS — E119 Type 2 diabetes mellitus without complications: Secondary | ICD-10-CM | POA: Diagnosis not present

## 2018-02-20 DIAGNOSIS — I10 Essential (primary) hypertension: Secondary | ICD-10-CM | POA: Diagnosis not present

## 2018-02-23 DIAGNOSIS — I10 Essential (primary) hypertension: Secondary | ICD-10-CM | POA: Diagnosis not present

## 2018-07-20 DIAGNOSIS — E119 Type 2 diabetes mellitus without complications: Secondary | ICD-10-CM | POA: Diagnosis not present

## 2018-07-20 DIAGNOSIS — E669 Obesity, unspecified: Secondary | ICD-10-CM | POA: Diagnosis not present

## 2018-07-20 DIAGNOSIS — Z6841 Body Mass Index (BMI) 40.0 and over, adult: Secondary | ICD-10-CM | POA: Diagnosis not present

## 2018-07-20 DIAGNOSIS — E6609 Other obesity due to excess calories: Secondary | ICD-10-CM | POA: Diagnosis not present

## 2018-07-20 DIAGNOSIS — C61 Malignant neoplasm of prostate: Secondary | ICD-10-CM | POA: Diagnosis not present

## 2018-07-20 DIAGNOSIS — I10 Essential (primary) hypertension: Secondary | ICD-10-CM | POA: Diagnosis not present

## 2018-09-20 DIAGNOSIS — I10 Essential (primary) hypertension: Secondary | ICD-10-CM | POA: Diagnosis not present

## 2018-09-20 DIAGNOSIS — M10072 Idiopathic gout, left ankle and foot: Secondary | ICD-10-CM | POA: Diagnosis not present

## 2018-09-20 DIAGNOSIS — E6609 Other obesity due to excess calories: Secondary | ICD-10-CM | POA: Diagnosis not present

## 2018-09-20 DIAGNOSIS — E119 Type 2 diabetes mellitus without complications: Secondary | ICD-10-CM | POA: Diagnosis not present

## 2018-09-20 DIAGNOSIS — Z6841 Body Mass Index (BMI) 40.0 and over, adult: Secondary | ICD-10-CM | POA: Diagnosis not present

## 2018-11-27 DIAGNOSIS — E669 Obesity, unspecified: Secondary | ICD-10-CM | POA: Diagnosis not present

## 2018-11-27 DIAGNOSIS — I10 Essential (primary) hypertension: Secondary | ICD-10-CM | POA: Diagnosis not present

## 2018-11-27 DIAGNOSIS — Z6841 Body Mass Index (BMI) 40.0 and over, adult: Secondary | ICD-10-CM | POA: Diagnosis not present

## 2018-11-27 DIAGNOSIS — E119 Type 2 diabetes mellitus without complications: Secondary | ICD-10-CM | POA: Diagnosis not present

## 2018-12-25 DIAGNOSIS — J069 Acute upper respiratory infection, unspecified: Secondary | ICD-10-CM | POA: Diagnosis not present

## 2019-02-26 DIAGNOSIS — E6609 Other obesity due to excess calories: Secondary | ICD-10-CM | POA: Diagnosis not present

## 2019-02-26 DIAGNOSIS — E1169 Type 2 diabetes mellitus with other specified complication: Secondary | ICD-10-CM | POA: Diagnosis not present

## 2019-02-26 DIAGNOSIS — I10 Essential (primary) hypertension: Secondary | ICD-10-CM | POA: Diagnosis not present

## 2019-02-26 DIAGNOSIS — E785 Hyperlipidemia, unspecified: Secondary | ICD-10-CM | POA: Diagnosis not present

## 2019-03-05 DIAGNOSIS — E669 Obesity, unspecified: Secondary | ICD-10-CM | POA: Diagnosis not present

## 2019-03-05 DIAGNOSIS — E1169 Type 2 diabetes mellitus with other specified complication: Secondary | ICD-10-CM | POA: Diagnosis not present

## 2019-03-05 DIAGNOSIS — I10 Essential (primary) hypertension: Secondary | ICD-10-CM | POA: Diagnosis not present

## 2019-03-19 DIAGNOSIS — I10 Essential (primary) hypertension: Secondary | ICD-10-CM | POA: Diagnosis not present

## 2019-03-19 DIAGNOSIS — E1169 Type 2 diabetes mellitus with other specified complication: Secondary | ICD-10-CM | POA: Diagnosis not present

## 2019-04-02 DIAGNOSIS — E0800 Diabetes mellitus due to underlying condition with hyperosmolarity without nonketotic hyperglycemic-hyperosmolar coma (NKHHC): Secondary | ICD-10-CM | POA: Diagnosis not present

## 2019-04-02 DIAGNOSIS — Z7189 Other specified counseling: Secondary | ICD-10-CM | POA: Diagnosis not present

## 2019-04-02 DIAGNOSIS — I10 Essential (primary) hypertension: Secondary | ICD-10-CM | POA: Diagnosis not present

## 2019-04-24 DIAGNOSIS — I10 Essential (primary) hypertension: Secondary | ICD-10-CM | POA: Diagnosis not present

## 2019-04-24 DIAGNOSIS — F430 Acute stress reaction: Secondary | ICD-10-CM | POA: Diagnosis not present

## 2019-04-24 DIAGNOSIS — Z0000 Encounter for general adult medical examination without abnormal findings: Secondary | ICD-10-CM | POA: Diagnosis not present

## 2019-04-24 DIAGNOSIS — E6609 Other obesity due to excess calories: Secondary | ICD-10-CM | POA: Diagnosis not present

## 2019-04-24 DIAGNOSIS — E1169 Type 2 diabetes mellitus with other specified complication: Secondary | ICD-10-CM | POA: Diagnosis not present

## 2019-05-01 DIAGNOSIS — I10 Essential (primary) hypertension: Secondary | ICD-10-CM | POA: Diagnosis not present

## 2019-05-01 DIAGNOSIS — E1169 Type 2 diabetes mellitus with other specified complication: Secondary | ICD-10-CM | POA: Diagnosis not present

## 2019-05-01 DIAGNOSIS — Z7189 Other specified counseling: Secondary | ICD-10-CM | POA: Diagnosis not present

## 2019-05-06 DIAGNOSIS — I10 Essential (primary) hypertension: Secondary | ICD-10-CM | POA: Diagnosis not present

## 2019-05-06 DIAGNOSIS — E669 Obesity, unspecified: Secondary | ICD-10-CM | POA: Diagnosis not present

## 2019-06-05 DIAGNOSIS — E669 Obesity, unspecified: Secondary | ICD-10-CM | POA: Diagnosis not present

## 2019-06-05 DIAGNOSIS — Z7189 Other specified counseling: Secondary | ICD-10-CM | POA: Diagnosis not present

## 2019-06-05 DIAGNOSIS — E1169 Type 2 diabetes mellitus with other specified complication: Secondary | ICD-10-CM | POA: Diagnosis not present

## 2019-06-05 DIAGNOSIS — I10 Essential (primary) hypertension: Secondary | ICD-10-CM | POA: Diagnosis not present

## 2019-06-07 DIAGNOSIS — I10 Essential (primary) hypertension: Secondary | ICD-10-CM | POA: Diagnosis not present

## 2019-06-07 DIAGNOSIS — R7309 Other abnormal glucose: Secondary | ICD-10-CM | POA: Diagnosis not present

## 2019-08-19 DIAGNOSIS — E1169 Type 2 diabetes mellitus with other specified complication: Secondary | ICD-10-CM | POA: Diagnosis not present

## 2019-08-19 DIAGNOSIS — Z7189 Other specified counseling: Secondary | ICD-10-CM | POA: Diagnosis not present

## 2019-08-19 DIAGNOSIS — M10062 Idiopathic gout, left knee: Secondary | ICD-10-CM | POA: Diagnosis not present

## 2019-08-19 DIAGNOSIS — I10 Essential (primary) hypertension: Secondary | ICD-10-CM | POA: Diagnosis not present

## 2019-08-28 ENCOUNTER — Ambulatory Visit
Admission: RE | Admit: 2019-08-28 | Discharge: 2019-08-28 | Disposition: A | Discharge status: Home / Self Care | Payer: Medicare HMO | Source: Ambulatory Visit | Attending: Nurse Practitioner | Admitting: Nurse Practitioner

## 2019-08-28 ENCOUNTER — Other Ambulatory Visit: Payer: Self-pay | Admitting: Nurse Practitioner

## 2019-08-28 DIAGNOSIS — I10 Essential (primary) hypertension: Secondary | ICD-10-CM | POA: Diagnosis not present

## 2019-08-28 DIAGNOSIS — M25561 Pain in right knee: Secondary | ICD-10-CM

## 2019-08-28 DIAGNOSIS — M10061 Idiopathic gout, right knee: Secondary | ICD-10-CM | POA: Diagnosis not present

## 2019-08-28 DIAGNOSIS — E1169 Type 2 diabetes mellitus with other specified complication: Secondary | ICD-10-CM | POA: Diagnosis not present

## 2019-08-28 DIAGNOSIS — E119 Type 2 diabetes mellitus without complications: Secondary | ICD-10-CM | POA: Diagnosis not present

## 2019-09-04 DIAGNOSIS — H52223 Regular astigmatism, bilateral: Secondary | ICD-10-CM | POA: Diagnosis not present

## 2019-12-25 DIAGNOSIS — Z03818 Encounter for observation for suspected exposure to other biological agents ruled out: Secondary | ICD-10-CM | POA: Diagnosis not present

## 2020-01-12 ENCOUNTER — Ambulatory Visit: Payer: Medicare HMO | Attending: Internal Medicine

## 2020-01-12 DIAGNOSIS — Z23 Encounter for immunization: Secondary | ICD-10-CM

## 2020-01-12 NOTE — Progress Notes (Signed)
   Covid-19 Vaccination Clinic  Name:  Jerome Serrano    MRN: [id number] DOB: 12-14-1950  01/12/2020  [name] was observed post Covid-19 immunization for 15 minutes without incidence. He was provided with Vaccine Information Sheet and instruction to access the V-Safe system.   [name] was instructed to call 911 with any severe reactions post vaccine: Marland Kitchen Difficulty breathing  . Swelling of your face and throat  . A fast heartbeat  . A bad rash all over your body  . Dizziness and weakness    Immunizations Administered    Name Date Dose VIS Date Route   Pfizer COVID-19 Vaccine 01/12/2020 12:41 PM 0.3 mL 11/01/2019 Intramuscular   Manufacturer: [place]   Lot: [id number]   [place]: [id number]

## 2020-01-27 DIAGNOSIS — I10 Essential (primary) hypertension: Secondary | ICD-10-CM | POA: Diagnosis not present

## 2020-01-27 DIAGNOSIS — E1169 Type 2 diabetes mellitus with other specified complication: Secondary | ICD-10-CM | POA: Diagnosis not present

## 2020-01-27 DIAGNOSIS — Z6841 Body Mass Index (BMI) 40.0 and over, adult: Secondary | ICD-10-CM | POA: Diagnosis not present

## 2020-01-27 DIAGNOSIS — M109 Gout, unspecified: Secondary | ICD-10-CM | POA: Diagnosis not present

## 2020-01-27 DIAGNOSIS — M1000 Idiopathic gout, unspecified site: Secondary | ICD-10-CM | POA: Diagnosis not present

## 2020-02-05 ENCOUNTER — Ambulatory Visit: Payer: Medicare HMO | Attending: Internal Medicine

## 2020-02-05 DIAGNOSIS — Z23 Encounter for immunization: Secondary | ICD-10-CM

## 2020-02-05 NOTE — Progress Notes (Signed)
   Covid-19 Vaccination Clinic  Name:  ANANT GOODRIDGE    MRN: [id number] DOB: Aug 24, 1951  02/05/2020  Mr. Mikan was observed post Covid-19 immunization for 15 minutes without incident. He was provided with Vaccine Information Sheet and instruction to access the V-Safe system.   Mr. Shaub was instructed to call 911 with any severe reactions post vaccine: Marland Kitchen Difficulty breathing  . Swelling of face and throat  . A fast heartbeat  . A bad rash all over body  . Dizziness and weakness   Immunizations Administered    Name Date Dose VIS Date Route   Pfizer COVID-19 Vaccine 02/05/2020 12:12 PM 0.3 mL 11/01/2019 Intramuscular   Manufacturer: [employer]   Lot: [id number]   [place]: [id number]

## 2020-04-20 DIAGNOSIS — M10062 Idiopathic gout, left knee: Secondary | ICD-10-CM | POA: Diagnosis not present

## 2020-04-20 DIAGNOSIS — I10 Essential (primary) hypertension: Secondary | ICD-10-CM | POA: Diagnosis not present

## 2020-04-20 DIAGNOSIS — G990 Autonomic neuropathy in diseases classified elsewhere: Secondary | ICD-10-CM | POA: Diagnosis not present

## 2020-04-20 DIAGNOSIS — E119 Type 2 diabetes mellitus without complications: Secondary | ICD-10-CM | POA: Diagnosis not present

## 2020-04-29 ENCOUNTER — Other Ambulatory Visit: Payer: Self-pay | Admitting: Nurse Practitioner

## 2020-04-29 ENCOUNTER — Ambulatory Visit
Admission: RE | Admit: 2020-04-29 | Discharge: 2020-04-29 | Disposition: A | Discharge status: Home / Self Care | Payer: Medicare HMO | Source: Ambulatory Visit | Attending: Nurse Practitioner | Admitting: Nurse Practitioner

## 2020-04-29 DIAGNOSIS — I10 Essential (primary) hypertension: Secondary | ICD-10-CM | POA: Diagnosis not present

## 2020-04-29 DIAGNOSIS — E119 Type 2 diabetes mellitus without complications: Secondary | ICD-10-CM | POA: Diagnosis not present

## 2020-04-29 DIAGNOSIS — E669 Obesity, unspecified: Secondary | ICD-10-CM | POA: Diagnosis not present

## 2020-04-29 DIAGNOSIS — M25512 Pain in left shoulder: Secondary | ICD-10-CM | POA: Diagnosis not present

## 2020-04-29 DIAGNOSIS — E1169 Type 2 diabetes mellitus with other specified complication: Secondary | ICD-10-CM | POA: Diagnosis not present

## 2020-04-29 DIAGNOSIS — M19012 Primary osteoarthritis, left shoulder: Secondary | ICD-10-CM | POA: Diagnosis not present

## 2020-04-29 DIAGNOSIS — E785 Hyperlipidemia, unspecified: Secondary | ICD-10-CM | POA: Diagnosis not present

## 2020-06-19 DIAGNOSIS — E119 Type 2 diabetes mellitus without complications: Secondary | ICD-10-CM | POA: Diagnosis not present

## 2020-06-19 DIAGNOSIS — M10062 Idiopathic gout, left knee: Secondary | ICD-10-CM | POA: Diagnosis not present

## 2020-06-19 DIAGNOSIS — I10 Essential (primary) hypertension: Secondary | ICD-10-CM | POA: Diagnosis not present

## 2020-06-19 DIAGNOSIS — G990 Autonomic neuropathy in diseases classified elsewhere: Secondary | ICD-10-CM | POA: Diagnosis not present

## 2020-07-21 DIAGNOSIS — G990 Autonomic neuropathy in diseases classified elsewhere: Secondary | ICD-10-CM | POA: Diagnosis not present

## 2020-07-21 DIAGNOSIS — E119 Type 2 diabetes mellitus without complications: Secondary | ICD-10-CM | POA: Diagnosis not present

## 2020-07-21 DIAGNOSIS — M10062 Idiopathic gout, left knee: Secondary | ICD-10-CM | POA: Diagnosis not present

## 2020-07-21 DIAGNOSIS — I10 Essential (primary) hypertension: Secondary | ICD-10-CM | POA: Diagnosis not present

## 2020-07-29 DIAGNOSIS — Z7189 Other specified counseling: Secondary | ICD-10-CM | POA: Diagnosis not present

## 2020-07-29 DIAGNOSIS — E119 Type 2 diabetes mellitus without complications: Secondary | ICD-10-CM | POA: Diagnosis not present

## 2020-07-29 DIAGNOSIS — I10 Essential (primary) hypertension: Secondary | ICD-10-CM | POA: Diagnosis not present

## 2020-07-29 DIAGNOSIS — E1169 Type 2 diabetes mellitus with other specified complication: Secondary | ICD-10-CM | POA: Diagnosis not present

## 2020-07-29 DIAGNOSIS — E785 Hyperlipidemia, unspecified: Secondary | ICD-10-CM | POA: Diagnosis not present

## 2020-07-29 DIAGNOSIS — Z0000 Encounter for general adult medical examination without abnormal findings: Secondary | ICD-10-CM | POA: Diagnosis not present

## 2020-08-20 DIAGNOSIS — G990 Autonomic neuropathy in diseases classified elsewhere: Secondary | ICD-10-CM | POA: Diagnosis not present

## 2020-08-20 DIAGNOSIS — E119 Type 2 diabetes mellitus without complications: Secondary | ICD-10-CM | POA: Diagnosis not present

## 2020-08-20 DIAGNOSIS — I10 Essential (primary) hypertension: Secondary | ICD-10-CM | POA: Diagnosis not present

## 2020-08-20 DIAGNOSIS — M10062 Idiopathic gout, left knee: Secondary | ICD-10-CM | POA: Diagnosis not present

## 2020-08-27 DIAGNOSIS — I10 Essential (primary) hypertension: Secondary | ICD-10-CM | POA: Diagnosis not present

## 2020-08-27 DIAGNOSIS — E119 Type 2 diabetes mellitus without complications: Secondary | ICD-10-CM | POA: Diagnosis not present

## 2020-08-27 DIAGNOSIS — Z7189 Other specified counseling: Secondary | ICD-10-CM | POA: Diagnosis not present

## 2020-09-19 DIAGNOSIS — M10062 Idiopathic gout, left knee: Secondary | ICD-10-CM | POA: Diagnosis not present

## 2020-09-19 DIAGNOSIS — E119 Type 2 diabetes mellitus without complications: Secondary | ICD-10-CM | POA: Diagnosis not present

## 2020-09-19 DIAGNOSIS — G990 Autonomic neuropathy in diseases classified elsewhere: Secondary | ICD-10-CM | POA: Diagnosis not present

## 2020-09-19 DIAGNOSIS — I10 Essential (primary) hypertension: Secondary | ICD-10-CM | POA: Diagnosis not present

## 2020-10-20 DIAGNOSIS — M10062 Idiopathic gout, left knee: Secondary | ICD-10-CM | POA: Diagnosis not present

## 2020-10-20 DIAGNOSIS — I10 Essential (primary) hypertension: Secondary | ICD-10-CM | POA: Diagnosis not present

## 2020-10-20 DIAGNOSIS — G990 Autonomic neuropathy in diseases classified elsewhere: Secondary | ICD-10-CM | POA: Diagnosis not present

## 2020-10-20 DIAGNOSIS — E119 Type 2 diabetes mellitus without complications: Secondary | ICD-10-CM | POA: Diagnosis not present

## 2020-11-20 DIAGNOSIS — I10 Essential (primary) hypertension: Secondary | ICD-10-CM | POA: Diagnosis not present

## 2020-11-20 DIAGNOSIS — E119 Type 2 diabetes mellitus without complications: Secondary | ICD-10-CM | POA: Diagnosis not present

## 2020-11-20 DIAGNOSIS — G990 Autonomic neuropathy in diseases classified elsewhere: Secondary | ICD-10-CM | POA: Diagnosis not present

## 2020-11-20 DIAGNOSIS — M10062 Idiopathic gout, left knee: Secondary | ICD-10-CM | POA: Diagnosis not present

## 2020-11-28 DIAGNOSIS — Z1152 Encounter for screening for COVID-19: Secondary | ICD-10-CM | POA: Diagnosis not present

## 2020-12-03 DIAGNOSIS — I10 Essential (primary) hypertension: Secondary | ICD-10-CM | POA: Diagnosis not present

## 2020-12-03 DIAGNOSIS — Z7189 Other specified counseling: Secondary | ICD-10-CM | POA: Diagnosis not present

## 2020-12-03 DIAGNOSIS — E785 Hyperlipidemia, unspecified: Secondary | ICD-10-CM | POA: Diagnosis not present

## 2020-12-03 DIAGNOSIS — F1290 Cannabis use, unspecified, uncomplicated: Secondary | ICD-10-CM | POA: Diagnosis not present

## 2020-12-03 DIAGNOSIS — E119 Type 2 diabetes mellitus without complications: Secondary | ICD-10-CM | POA: Diagnosis not present

## 2020-12-03 DIAGNOSIS — E6609 Other obesity due to excess calories: Secondary | ICD-10-CM | POA: Diagnosis not present

## 2020-12-03 DIAGNOSIS — G990 Autonomic neuropathy in diseases classified elsewhere: Secondary | ICD-10-CM | POA: Diagnosis not present

## 2020-12-19 DIAGNOSIS — G990 Autonomic neuropathy in diseases classified elsewhere: Secondary | ICD-10-CM | POA: Diagnosis not present

## 2020-12-19 DIAGNOSIS — M10062 Idiopathic gout, left knee: Secondary | ICD-10-CM | POA: Diagnosis not present

## 2020-12-19 DIAGNOSIS — E119 Type 2 diabetes mellitus without complications: Secondary | ICD-10-CM | POA: Diagnosis not present

## 2020-12-19 DIAGNOSIS — I10 Essential (primary) hypertension: Secondary | ICD-10-CM | POA: Diagnosis not present

## 2021-01-18 DIAGNOSIS — G990 Autonomic neuropathy in diseases classified elsewhere: Secondary | ICD-10-CM | POA: Diagnosis not present

## 2021-01-18 DIAGNOSIS — E119 Type 2 diabetes mellitus without complications: Secondary | ICD-10-CM | POA: Diagnosis not present

## 2021-01-18 DIAGNOSIS — I10 Essential (primary) hypertension: Secondary | ICD-10-CM | POA: Diagnosis not present

## 2021-02-13 DIAGNOSIS — E785 Hyperlipidemia, unspecified: Secondary | ICD-10-CM | POA: Diagnosis not present

## 2021-02-13 DIAGNOSIS — I110 Hypertensive heart disease with heart failure: Secondary | ICD-10-CM | POA: Diagnosis not present

## 2021-02-13 DIAGNOSIS — R609 Edema, unspecified: Secondary | ICD-10-CM | POA: Diagnosis not present

## 2021-02-13 DIAGNOSIS — I509 Heart failure, unspecified: Secondary | ICD-10-CM | POA: Diagnosis not present

## 2021-02-13 DIAGNOSIS — E782 Mixed hyperlipidemia: Secondary | ICD-10-CM | POA: Diagnosis not present

## 2021-02-13 DIAGNOSIS — I10 Essential (primary) hypertension: Secondary | ICD-10-CM | POA: Diagnosis not present

## 2021-02-13 DIAGNOSIS — B353 Tinea pedis: Secondary | ICD-10-CM | POA: Diagnosis not present

## 2021-02-13 DIAGNOSIS — Z7189 Other specified counseling: Secondary | ICD-10-CM | POA: Diagnosis not present

## 2021-02-18 DIAGNOSIS — E119 Type 2 diabetes mellitus without complications: Secondary | ICD-10-CM | POA: Diagnosis not present

## 2021-02-18 DIAGNOSIS — I509 Heart failure, unspecified: Secondary | ICD-10-CM | POA: Diagnosis not present

## 2021-02-18 DIAGNOSIS — I10 Essential (primary) hypertension: Secondary | ICD-10-CM | POA: Diagnosis not present

## 2021-02-18 DIAGNOSIS — G990 Autonomic neuropathy in diseases classified elsewhere: Secondary | ICD-10-CM | POA: Diagnosis not present

## 2021-02-22 DIAGNOSIS — C61 Malignant neoplasm of prostate: Secondary | ICD-10-CM | POA: Diagnosis not present

## 2021-02-22 DIAGNOSIS — E785 Hyperlipidemia, unspecified: Secondary | ICD-10-CM | POA: Diagnosis not present

## 2021-02-22 DIAGNOSIS — I10 Essential (primary) hypertension: Secondary | ICD-10-CM | POA: Diagnosis not present

## 2021-02-22 DIAGNOSIS — Z7189 Other specified counseling: Secondary | ICD-10-CM | POA: Diagnosis not present

## 2021-02-22 DIAGNOSIS — E1169 Type 2 diabetes mellitus with other specified complication: Secondary | ICD-10-CM | POA: Diagnosis not present

## 2021-03-20 DIAGNOSIS — E119 Type 2 diabetes mellitus without complications: Secondary | ICD-10-CM | POA: Diagnosis not present

## 2021-03-20 DIAGNOSIS — I10 Essential (primary) hypertension: Secondary | ICD-10-CM | POA: Diagnosis not present

## 2021-03-20 DIAGNOSIS — M10062 Idiopathic gout, left knee: Secondary | ICD-10-CM | POA: Diagnosis not present

## 2021-03-20 DIAGNOSIS — G990 Autonomic neuropathy in diseases classified elsewhere: Secondary | ICD-10-CM | POA: Diagnosis not present

## 2021-04-20 DIAGNOSIS — I10 Essential (primary) hypertension: Secondary | ICD-10-CM | POA: Diagnosis not present

## 2021-04-20 DIAGNOSIS — E119 Type 2 diabetes mellitus without complications: Secondary | ICD-10-CM | POA: Diagnosis not present

## 2021-04-20 DIAGNOSIS — M10062 Idiopathic gout, left knee: Secondary | ICD-10-CM | POA: Diagnosis not present

## 2021-04-20 DIAGNOSIS — G990 Autonomic neuropathy in diseases classified elsewhere: Secondary | ICD-10-CM | POA: Diagnosis not present

## 2021-04-26 DIAGNOSIS — E669 Obesity, unspecified: Secondary | ICD-10-CM | POA: Diagnosis not present

## 2021-04-26 DIAGNOSIS — E119 Type 2 diabetes mellitus without complications: Secondary | ICD-10-CM | POA: Diagnosis not present

## 2021-04-26 DIAGNOSIS — R609 Edema, unspecified: Secondary | ICD-10-CM | POA: Diagnosis not present

## 2021-04-26 DIAGNOSIS — E785 Hyperlipidemia, unspecified: Secondary | ICD-10-CM | POA: Diagnosis not present

## 2021-04-30 DIAGNOSIS — H52222 Regular astigmatism, left eye: Secondary | ICD-10-CM | POA: Diagnosis not present

## 2021-04-30 DIAGNOSIS — H25013 Cortical age-related cataract, bilateral: Secondary | ICD-10-CM | POA: Diagnosis not present

## 2021-04-30 DIAGNOSIS — H40053 Ocular hypertension, bilateral: Secondary | ICD-10-CM | POA: Diagnosis not present

## 2021-04-30 DIAGNOSIS — H5203 Hypermetropia, bilateral: Secondary | ICD-10-CM | POA: Diagnosis not present

## 2021-04-30 DIAGNOSIS — E113312 Type 2 diabetes mellitus with moderate nonproliferative diabetic retinopathy with macular edema, left eye: Secondary | ICD-10-CM | POA: Diagnosis not present

## 2021-04-30 DIAGNOSIS — H25043 Posterior subcapsular polar age-related cataract, bilateral: Secondary | ICD-10-CM | POA: Diagnosis not present

## 2021-04-30 DIAGNOSIS — H2513 Age-related nuclear cataract, bilateral: Secondary | ICD-10-CM | POA: Diagnosis not present

## 2021-04-30 DIAGNOSIS — E113391 Type 2 diabetes mellitus with moderate nonproliferative diabetic retinopathy without macular edema, right eye: Secondary | ICD-10-CM | POA: Diagnosis not present

## 2021-05-10 DIAGNOSIS — E782 Mixed hyperlipidemia: Secondary | ICD-10-CM | POA: Diagnosis not present

## 2021-05-10 DIAGNOSIS — E1169 Type 2 diabetes mellitus with other specified complication: Secondary | ICD-10-CM | POA: Diagnosis not present

## 2021-05-10 DIAGNOSIS — R600 Localized edema: Secondary | ICD-10-CM | POA: Diagnosis not present

## 2021-05-10 DIAGNOSIS — I10 Essential (primary) hypertension: Secondary | ICD-10-CM | POA: Diagnosis not present

## 2021-05-17 DIAGNOSIS — H35033 Hypertensive retinopathy, bilateral: Secondary | ICD-10-CM | POA: Diagnosis not present

## 2021-05-17 DIAGNOSIS — E113312 Type 2 diabetes mellitus with moderate nonproliferative diabetic retinopathy with macular edema, left eye: Secondary | ICD-10-CM | POA: Diagnosis not present

## 2021-05-17 DIAGNOSIS — E113411 Type 2 diabetes mellitus with severe nonproliferative diabetic retinopathy with macular edema, right eye: Secondary | ICD-10-CM | POA: Diagnosis not present

## 2021-05-17 DIAGNOSIS — H43823 Vitreomacular adhesion, bilateral: Secondary | ICD-10-CM | POA: Diagnosis not present

## 2021-05-20 DIAGNOSIS — M10062 Idiopathic gout, left knee: Secondary | ICD-10-CM | POA: Diagnosis not present

## 2021-05-20 DIAGNOSIS — E119 Type 2 diabetes mellitus without complications: Secondary | ICD-10-CM | POA: Diagnosis not present

## 2021-05-20 DIAGNOSIS — G990 Autonomic neuropathy in diseases classified elsewhere: Secondary | ICD-10-CM | POA: Diagnosis not present

## 2021-05-20 DIAGNOSIS — I10 Essential (primary) hypertension: Secondary | ICD-10-CM | POA: Diagnosis not present

## 2021-05-30 IMAGING — CR DG KNEE COMPLETE 4+V*R*
4 series · 4 of 4 positions shown · non-contrast
Comparison: None.

CLINICAL DATA: Right knee pain for 2 weeks without known injury.

EXAM:
RIGHT KNEE - COMPLETE 4+ VIEW

[t knee ap right]
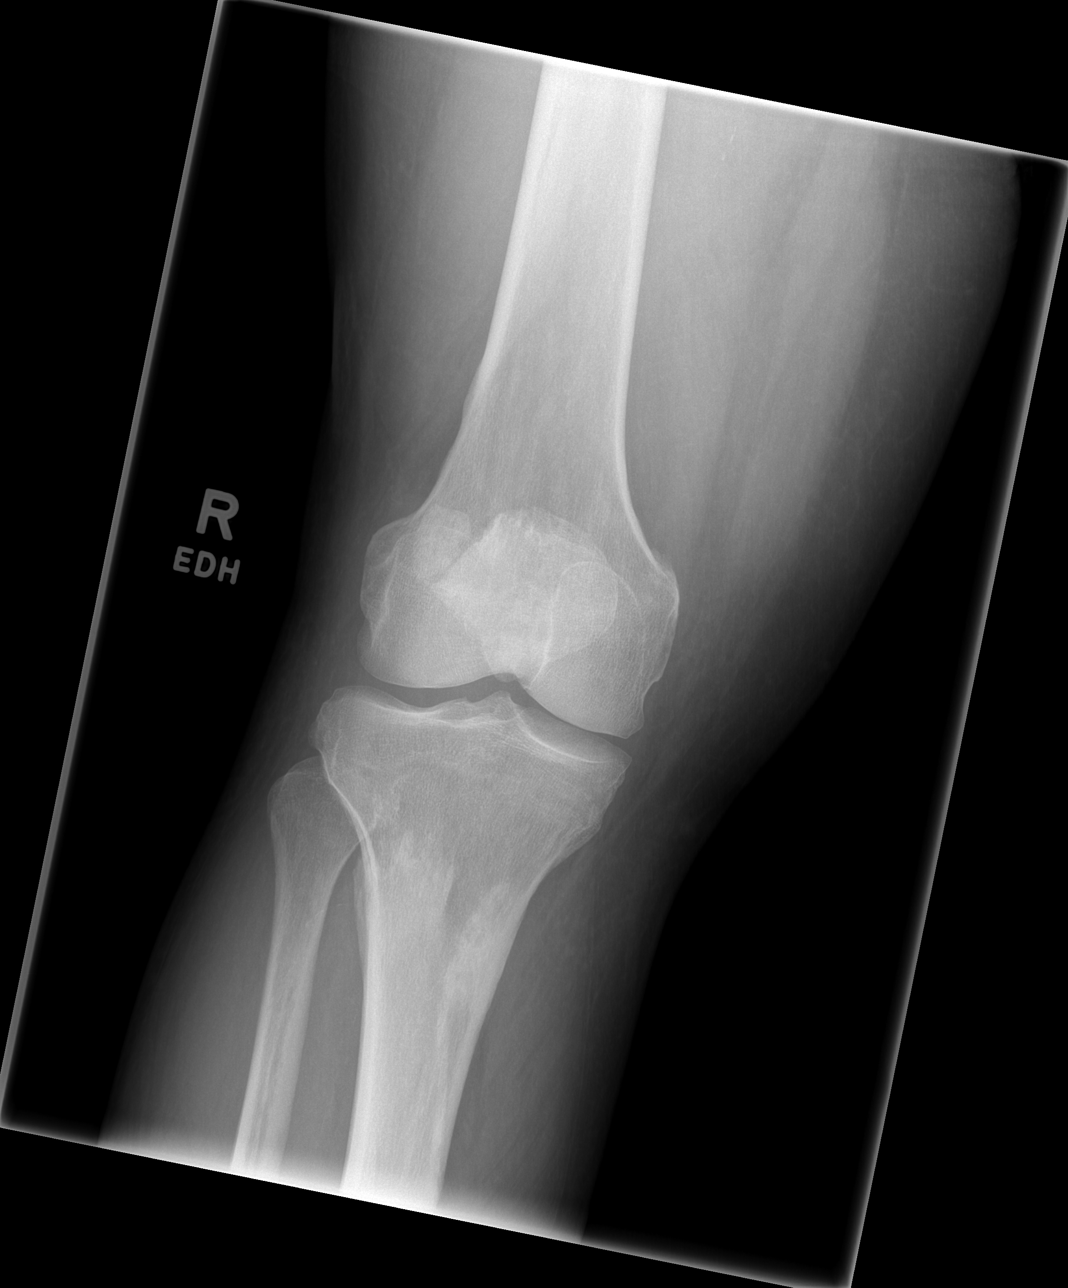

[t knee oblique right (1 of 2)]
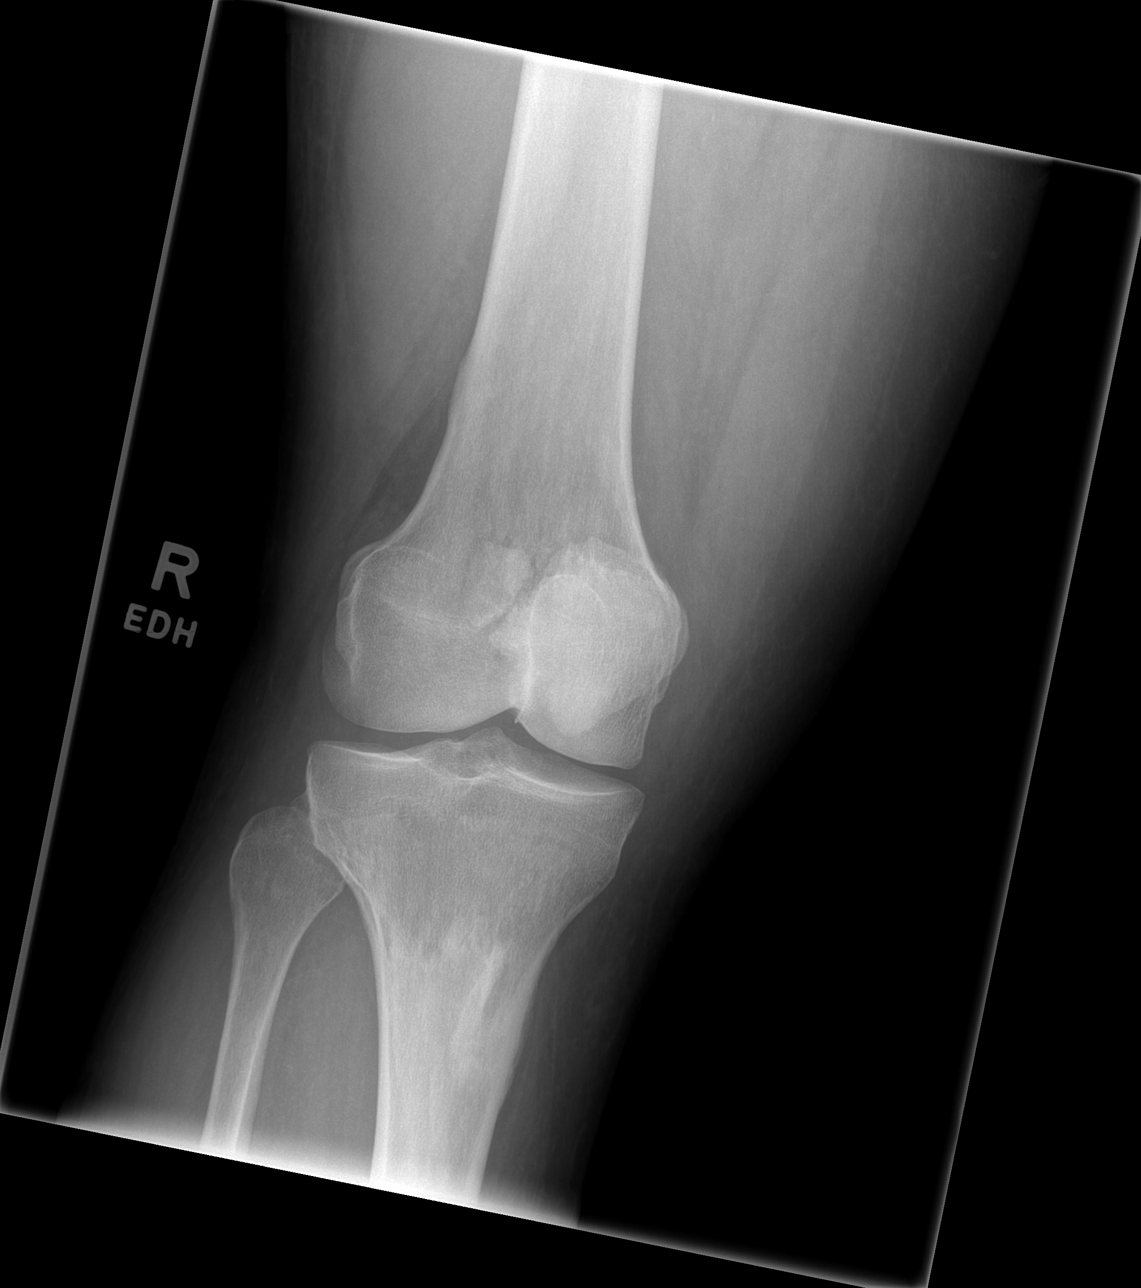

[t knee oblique right (2 of 2)]
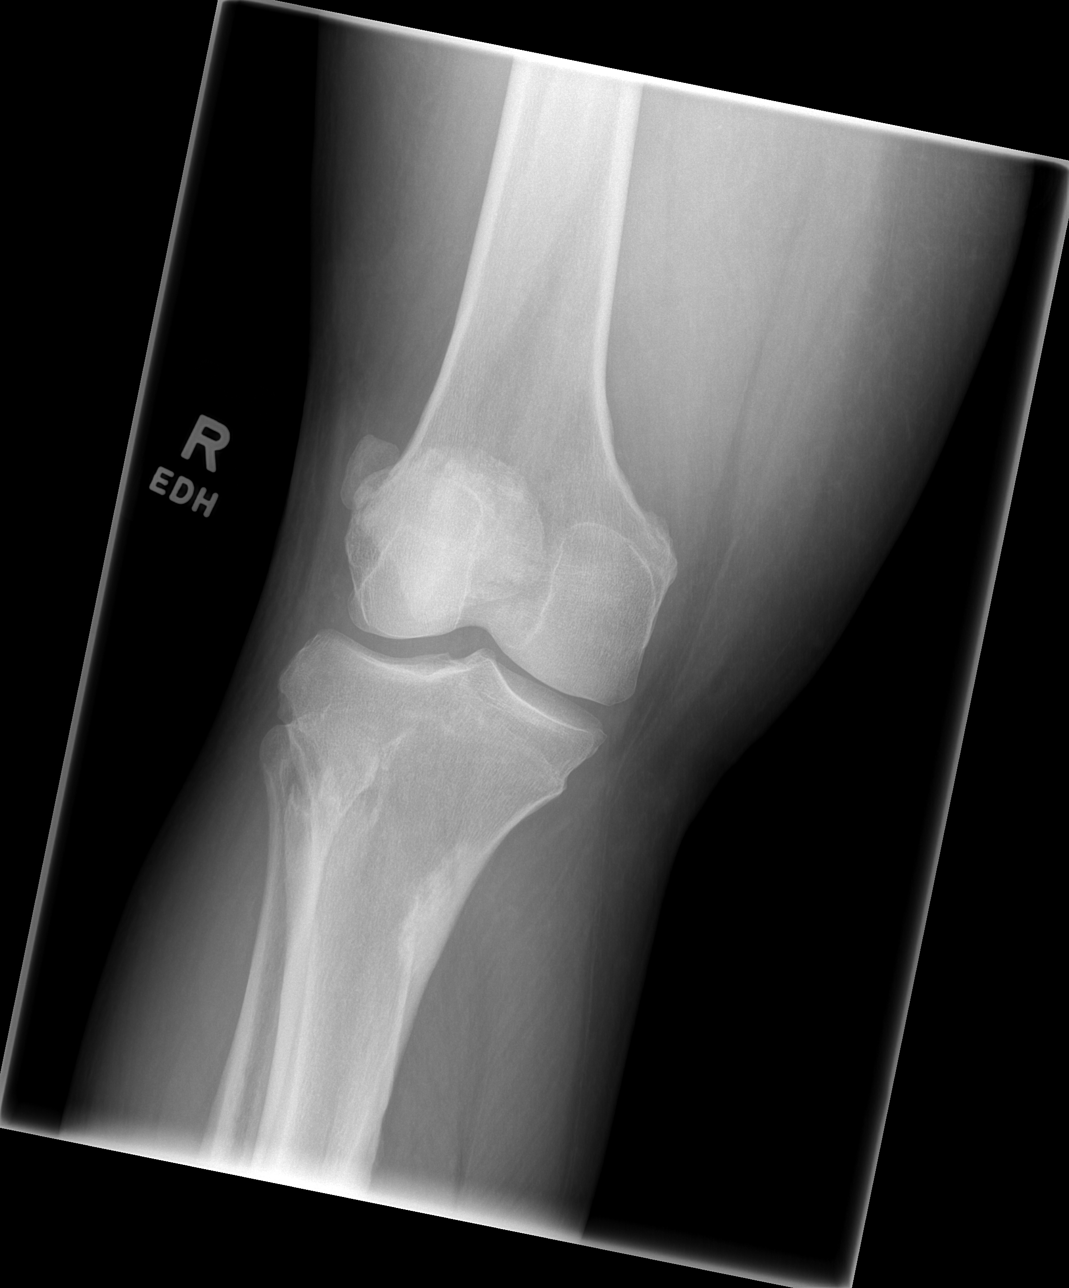

[t knee lat right]
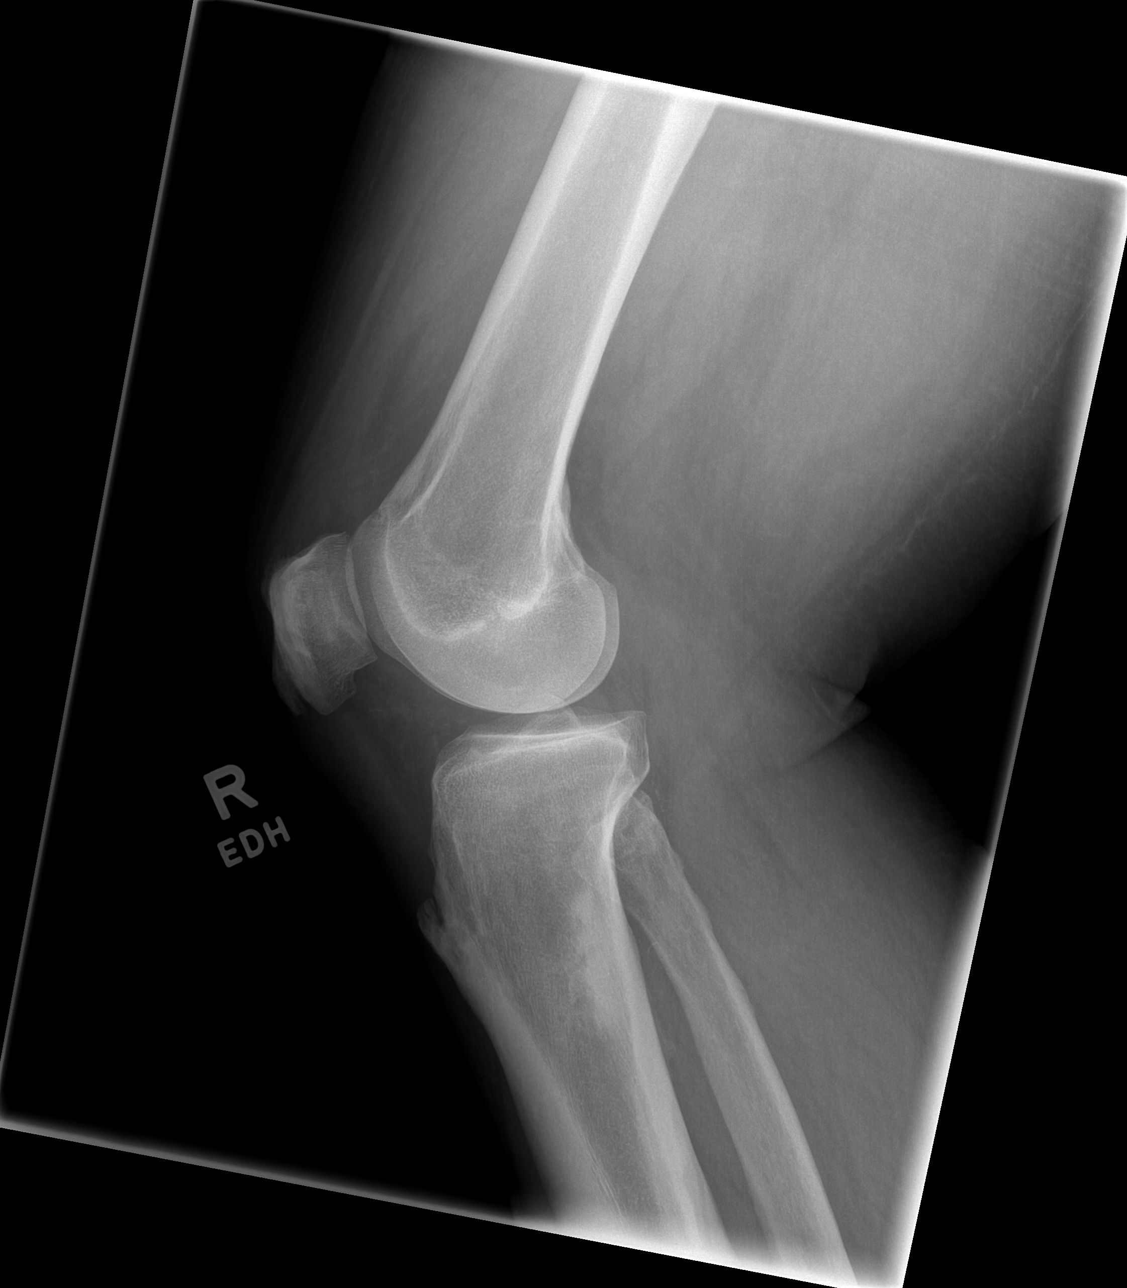

[4 of 4 positions shown; findings below may reference images not displayed]

FINDINGS: No evidence of fracture, dislocation, or joint effusion. No evidence
of arthropathy or other focal bone abnormality. Soft tissues are
unremarkable.
IMPRESSION: Negative.

## 2021-06-07 DIAGNOSIS — E1169 Type 2 diabetes mellitus with other specified complication: Secondary | ICD-10-CM | POA: Diagnosis not present

## 2021-06-07 DIAGNOSIS — I10 Essential (primary) hypertension: Secondary | ICD-10-CM | POA: Diagnosis not present

## 2021-06-07 DIAGNOSIS — R600 Localized edema: Secondary | ICD-10-CM | POA: Diagnosis not present

## 2021-06-20 DIAGNOSIS — G990 Autonomic neuropathy in diseases classified elsewhere: Secondary | ICD-10-CM | POA: Diagnosis not present

## 2021-06-20 DIAGNOSIS — E119 Type 2 diabetes mellitus without complications: Secondary | ICD-10-CM | POA: Diagnosis not present

## 2021-06-20 DIAGNOSIS — I10 Essential (primary) hypertension: Secondary | ICD-10-CM | POA: Diagnosis not present

## 2021-06-20 DIAGNOSIS — M10062 Idiopathic gout, left knee: Secondary | ICD-10-CM | POA: Diagnosis not present

## 2021-07-05 DIAGNOSIS — H35033 Hypertensive retinopathy, bilateral: Secondary | ICD-10-CM | POA: Diagnosis not present

## 2021-07-05 DIAGNOSIS — H43823 Vitreomacular adhesion, bilateral: Secondary | ICD-10-CM | POA: Diagnosis not present

## 2021-07-05 DIAGNOSIS — E113413 Type 2 diabetes mellitus with severe nonproliferative diabetic retinopathy with macular edema, bilateral: Secondary | ICD-10-CM | POA: Diagnosis not present

## 2021-07-05 DIAGNOSIS — H2513 Age-related nuclear cataract, bilateral: Secondary | ICD-10-CM | POA: Diagnosis not present

## 2021-07-12 DIAGNOSIS — E1169 Type 2 diabetes mellitus with other specified complication: Secondary | ICD-10-CM | POA: Diagnosis not present

## 2021-07-12 DIAGNOSIS — Z6841 Body Mass Index (BMI) 40.0 and over, adult: Secondary | ICD-10-CM | POA: Diagnosis not present

## 2021-07-12 DIAGNOSIS — M10062 Idiopathic gout, left knee: Secondary | ICD-10-CM | POA: Diagnosis not present

## 2021-07-12 DIAGNOSIS — E6609 Other obesity due to excess calories: Secondary | ICD-10-CM | POA: Diagnosis not present

## 2021-07-12 DIAGNOSIS — R600 Localized edema: Secondary | ICD-10-CM | POA: Diagnosis not present

## 2021-07-12 DIAGNOSIS — Z634 Disappearance and death of family member: Secondary | ICD-10-CM | POA: Diagnosis not present

## 2021-08-20 DIAGNOSIS — I10 Essential (primary) hypertension: Secondary | ICD-10-CM | POA: Diagnosis not present

## 2021-08-20 DIAGNOSIS — M10062 Idiopathic gout, left knee: Secondary | ICD-10-CM | POA: Diagnosis not present

## 2021-08-20 DIAGNOSIS — G990 Autonomic neuropathy in diseases classified elsewhere: Secondary | ICD-10-CM | POA: Diagnosis not present

## 2021-08-20 DIAGNOSIS — E119 Type 2 diabetes mellitus without complications: Secondary | ICD-10-CM | POA: Diagnosis not present

## 2021-09-19 DIAGNOSIS — G990 Autonomic neuropathy in diseases classified elsewhere: Secondary | ICD-10-CM | POA: Diagnosis not present

## 2021-09-19 DIAGNOSIS — E119 Type 2 diabetes mellitus without complications: Secondary | ICD-10-CM | POA: Diagnosis not present

## 2021-09-19 DIAGNOSIS — I10 Essential (primary) hypertension: Secondary | ICD-10-CM | POA: Diagnosis not present

## 2021-09-20 DIAGNOSIS — E1169 Type 2 diabetes mellitus with other specified complication: Secondary | ICD-10-CM | POA: Diagnosis not present

## 2021-09-20 DIAGNOSIS — I10 Essential (primary) hypertension: Secondary | ICD-10-CM | POA: Diagnosis not present

## 2021-09-20 DIAGNOSIS — Z23 Encounter for immunization: Secondary | ICD-10-CM | POA: Diagnosis not present

## 2021-09-20 DIAGNOSIS — R600 Localized edema: Secondary | ICD-10-CM | POA: Diagnosis not present

## 2021-09-20 DIAGNOSIS — E785 Hyperlipidemia, unspecified: Secondary | ICD-10-CM | POA: Diagnosis not present

## 2021-10-25 DIAGNOSIS — H43823 Vitreomacular adhesion, bilateral: Secondary | ICD-10-CM | POA: Diagnosis not present

## 2021-10-25 DIAGNOSIS — E113413 Type 2 diabetes mellitus with severe nonproliferative diabetic retinopathy with macular edema, bilateral: Secondary | ICD-10-CM | POA: Diagnosis not present

## 2021-10-25 DIAGNOSIS — H35033 Hypertensive retinopathy, bilateral: Secondary | ICD-10-CM | POA: Diagnosis not present

## 2021-10-25 DIAGNOSIS — H2513 Age-related nuclear cataract, bilateral: Secondary | ICD-10-CM | POA: Diagnosis not present

## 2021-10-26 DIAGNOSIS — R600 Localized edema: Secondary | ICD-10-CM | POA: Diagnosis not present

## 2021-10-26 DIAGNOSIS — E785 Hyperlipidemia, unspecified: Secondary | ICD-10-CM | POA: Diagnosis not present

## 2021-10-26 DIAGNOSIS — I10 Essential (primary) hypertension: Secondary | ICD-10-CM | POA: Diagnosis not present

## 2021-10-26 DIAGNOSIS — E1169 Type 2 diabetes mellitus with other specified complication: Secondary | ICD-10-CM | POA: Diagnosis not present

## 2021-12-19 DIAGNOSIS — M10062 Idiopathic gout, left knee: Secondary | ICD-10-CM | POA: Diagnosis not present

## 2021-12-19 DIAGNOSIS — G990 Autonomic neuropathy in diseases classified elsewhere: Secondary | ICD-10-CM | POA: Diagnosis not present

## 2021-12-19 DIAGNOSIS — E119 Type 2 diabetes mellitus without complications: Secondary | ICD-10-CM | POA: Diagnosis not present

## 2022-01-18 DIAGNOSIS — R972 Elevated prostate specific antigen [PSA]: Secondary | ICD-10-CM | POA: Diagnosis not present

## 2022-01-18 DIAGNOSIS — E1169 Type 2 diabetes mellitus with other specified complication: Secondary | ICD-10-CM | POA: Diagnosis not present

## 2022-01-18 DIAGNOSIS — I10 Essential (primary) hypertension: Secondary | ICD-10-CM | POA: Diagnosis not present

## 2022-01-18 DIAGNOSIS — M109 Gout, unspecified: Secondary | ICD-10-CM | POA: Diagnosis not present

## 2022-01-18 DIAGNOSIS — R600 Localized edema: Secondary | ICD-10-CM | POA: Diagnosis not present

## 2022-01-18 DIAGNOSIS — H6123 Impacted cerumen, bilateral: Secondary | ICD-10-CM | POA: Diagnosis not present

## 2022-01-18 DIAGNOSIS — E785 Hyperlipidemia, unspecified: Secondary | ICD-10-CM | POA: Diagnosis not present

## 2022-01-30 IMAGING — CR DG SHOULDER 2+V*L*
3 series · 3 of 3 positions shown · non-contrast
Comparison: No priors.

CLINICAL DATA: 69-year-old male with history of left-sided shoulder
pain for 1 year.

EXAM:
LEFT SHOULDER - 2+ VIEW

[w shoulder ap internal left *]
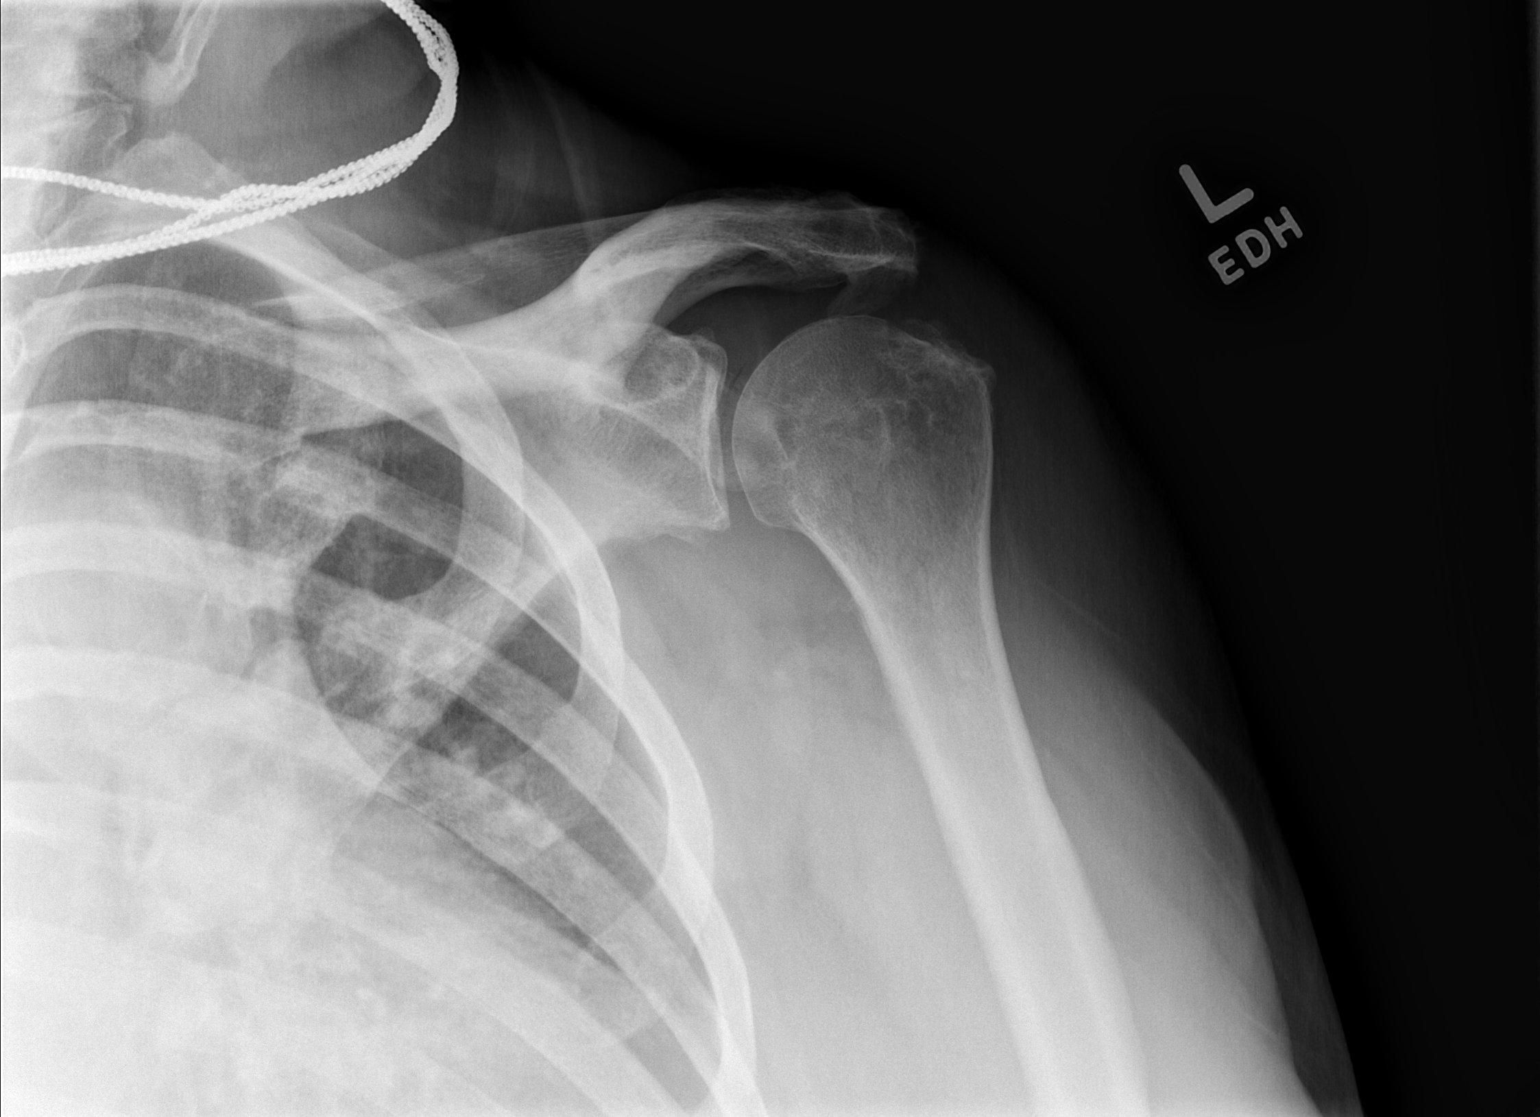

[w shoulder y view left *]
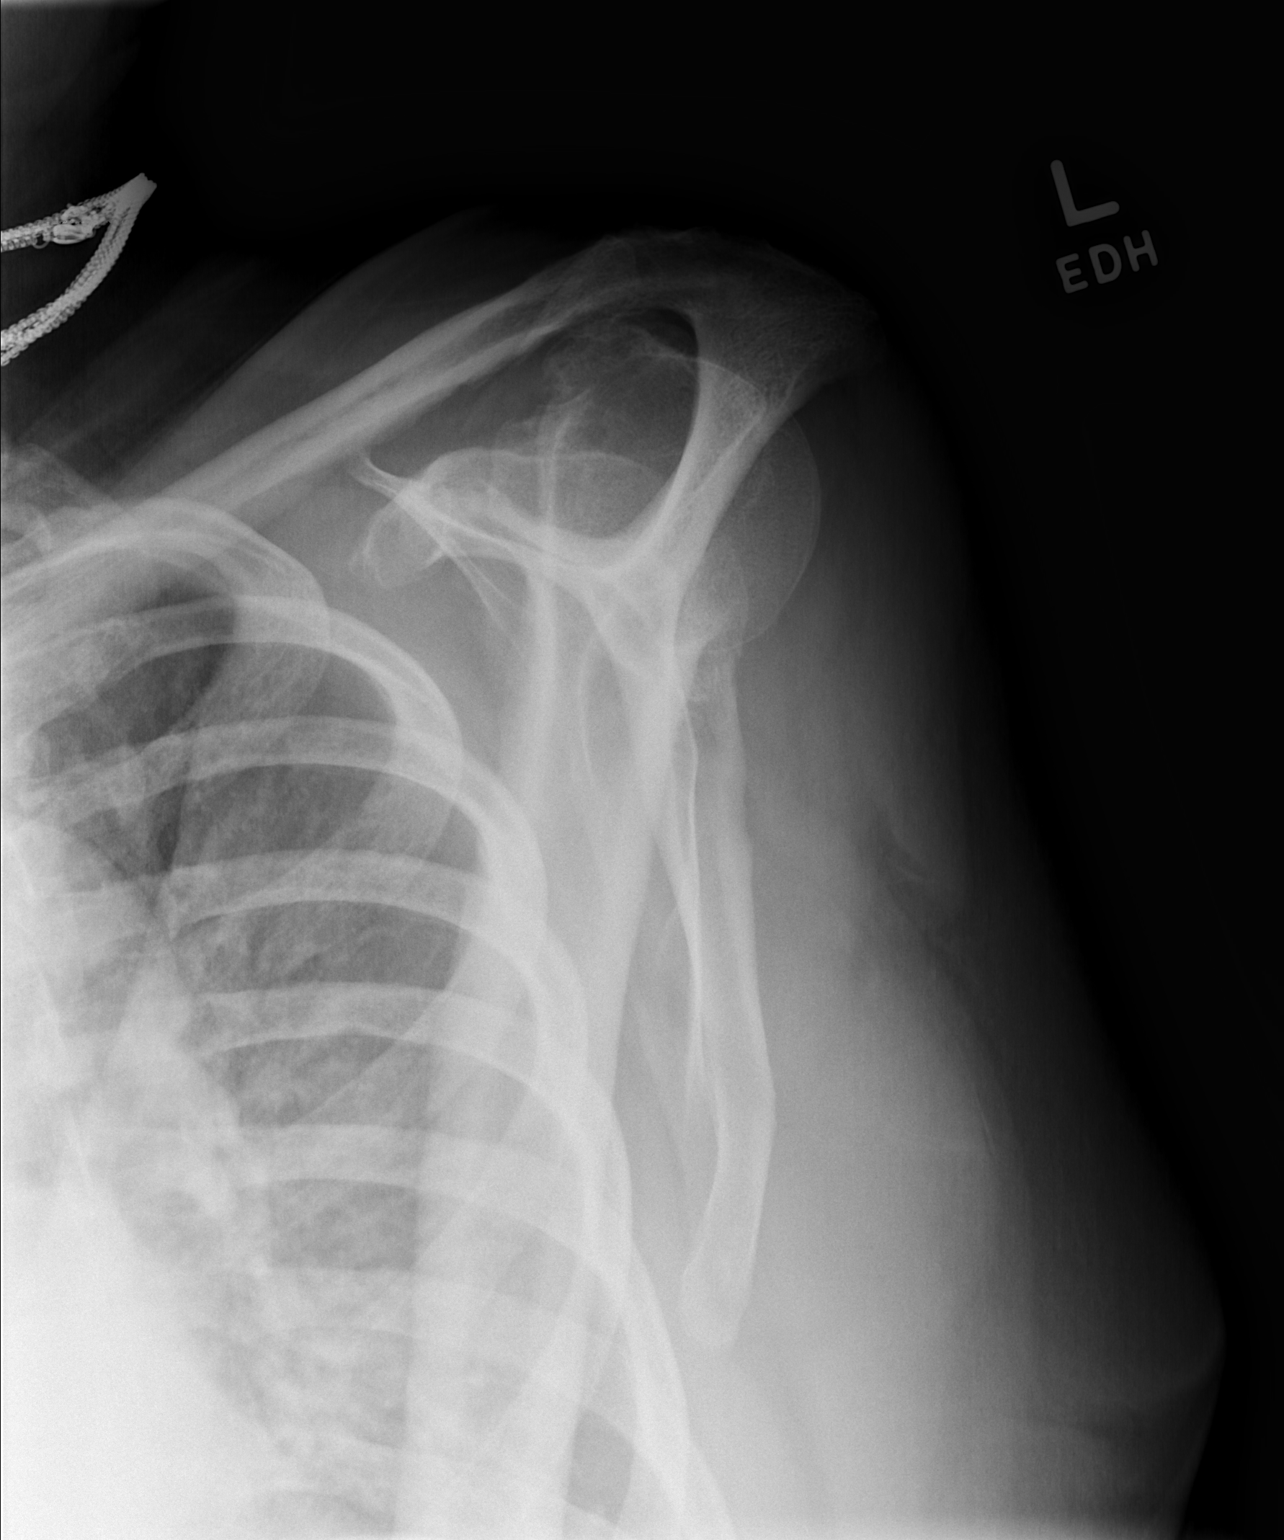

[w shoulder axillary left *]
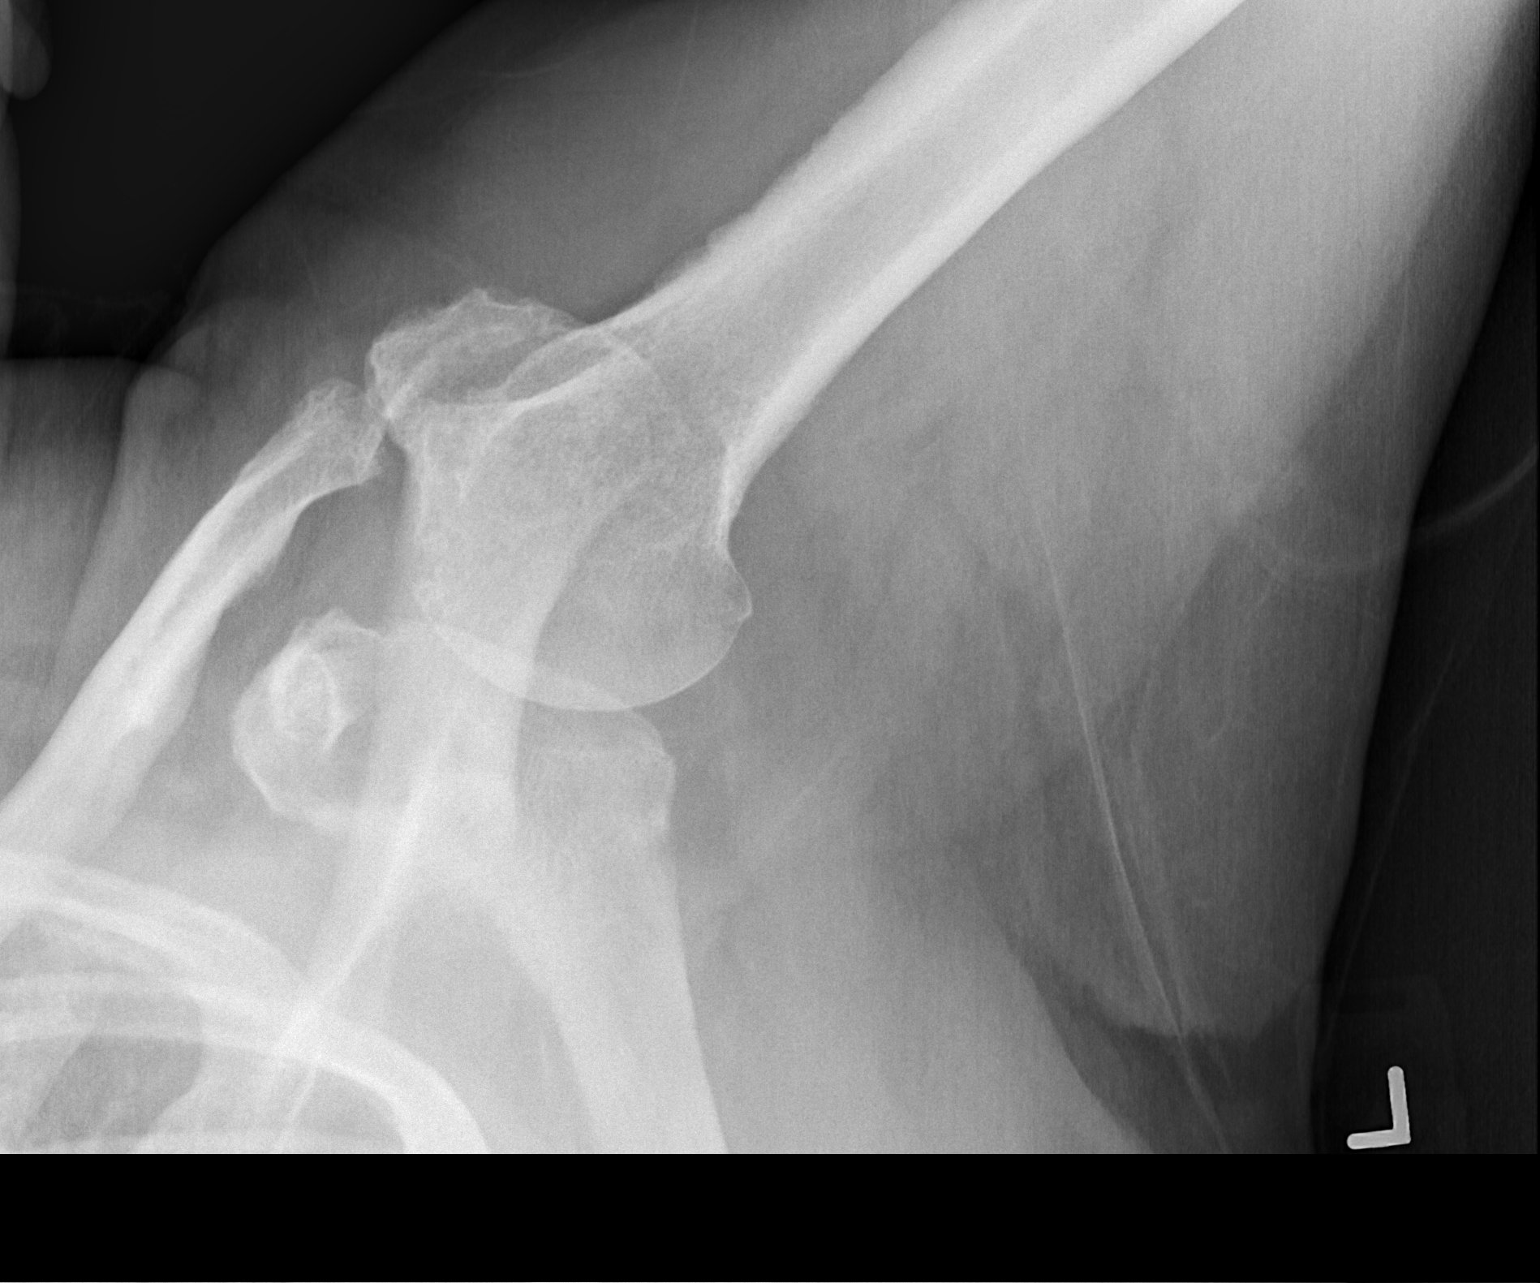

[3 of 3 positions shown; findings below may reference images not displayed]

FINDINGS: There is no evidence of fracture or dislocation. There is no
evidence of arthropathy or other focal bone abnormality.
Degenerative enthesopathic changes are noted around the femoral
neck.
IMPRESSION: 1. Mild chronic degenerative changes without acute radiographic
abnormality of the left shoulder.

## 2022-02-22 DIAGNOSIS — H35033 Hypertensive retinopathy, bilateral: Secondary | ICD-10-CM | POA: Diagnosis not present

## 2022-02-22 DIAGNOSIS — H2513 Age-related nuclear cataract, bilateral: Secondary | ICD-10-CM | POA: Diagnosis not present

## 2022-02-22 DIAGNOSIS — H43823 Vitreomacular adhesion, bilateral: Secondary | ICD-10-CM | POA: Diagnosis not present

## 2022-02-22 DIAGNOSIS — E113413 Type 2 diabetes mellitus with severe nonproliferative diabetic retinopathy with macular edema, bilateral: Secondary | ICD-10-CM | POA: Diagnosis not present

## 2022-03-20 DIAGNOSIS — E119 Type 2 diabetes mellitus without complications: Secondary | ICD-10-CM | POA: Diagnosis not present

## 2022-03-20 DIAGNOSIS — I10 Essential (primary) hypertension: Secondary | ICD-10-CM | POA: Diagnosis not present

## 2022-04-12 DIAGNOSIS — E785 Hyperlipidemia, unspecified: Secondary | ICD-10-CM | POA: Diagnosis not present

## 2022-04-12 DIAGNOSIS — I10 Essential (primary) hypertension: Secondary | ICD-10-CM | POA: Diagnosis not present

## 2022-04-12 DIAGNOSIS — E1169 Type 2 diabetes mellitus with other specified complication: Secondary | ICD-10-CM | POA: Diagnosis not present

## 2022-04-12 DIAGNOSIS — M109 Gout, unspecified: Secondary | ICD-10-CM | POA: Diagnosis not present

## 2022-04-12 DIAGNOSIS — Z0000 Encounter for general adult medical examination without abnormal findings: Secondary | ICD-10-CM | POA: Diagnosis not present

## 2022-04-12 DIAGNOSIS — R972 Elevated prostate specific antigen [PSA]: Secondary | ICD-10-CM | POA: Diagnosis not present

## 2022-04-12 DIAGNOSIS — Z6841 Body Mass Index (BMI) 40.0 and over, adult: Secondary | ICD-10-CM | POA: Diagnosis not present

## 2022-04-12 DIAGNOSIS — R600 Localized edema: Secondary | ICD-10-CM | POA: Diagnosis not present

## 2022-05-20 DIAGNOSIS — I10 Essential (primary) hypertension: Secondary | ICD-10-CM | POA: Diagnosis not present

## 2022-05-20 DIAGNOSIS — E119 Type 2 diabetes mellitus without complications: Secondary | ICD-10-CM | POA: Diagnosis not present

## 2022-05-27 DIAGNOSIS — H40053 Ocular hypertension, bilateral: Secondary | ICD-10-CM | POA: Diagnosis not present

## 2022-05-27 DIAGNOSIS — H5203 Hypermetropia, bilateral: Secondary | ICD-10-CM | POA: Diagnosis not present

## 2022-05-27 DIAGNOSIS — E113491 Type 2 diabetes mellitus with severe nonproliferative diabetic retinopathy without macular edema, right eye: Secondary | ICD-10-CM | POA: Diagnosis not present

## 2022-05-27 DIAGNOSIS — H25013 Cortical age-related cataract, bilateral: Secondary | ICD-10-CM | POA: Diagnosis not present

## 2022-05-27 DIAGNOSIS — H52223 Regular astigmatism, bilateral: Secondary | ICD-10-CM | POA: Diagnosis not present

## 2022-05-27 DIAGNOSIS — H2513 Age-related nuclear cataract, bilateral: Secondary | ICD-10-CM | POA: Diagnosis not present

## 2022-05-27 DIAGNOSIS — E113412 Type 2 diabetes mellitus with severe nonproliferative diabetic retinopathy with macular edema, left eye: Secondary | ICD-10-CM | POA: Diagnosis not present

## 2022-05-27 DIAGNOSIS — H25043 Posterior subcapsular polar age-related cataract, bilateral: Secondary | ICD-10-CM | POA: Diagnosis not present

## 2022-07-04 DIAGNOSIS — Z1212 Encounter for screening for malignant neoplasm of rectum: Secondary | ICD-10-CM | POA: Diagnosis not present

## 2022-07-04 DIAGNOSIS — Z1211 Encounter for screening for malignant neoplasm of colon: Secondary | ICD-10-CM | POA: Diagnosis not present

## 2022-07-12 DIAGNOSIS — R600 Localized edema: Secondary | ICD-10-CM | POA: Diagnosis not present

## 2022-07-12 DIAGNOSIS — Z6841 Body Mass Index (BMI) 40.0 and over, adult: Secondary | ICD-10-CM | POA: Diagnosis not present

## 2022-07-12 DIAGNOSIS — E785 Hyperlipidemia, unspecified: Secondary | ICD-10-CM | POA: Diagnosis not present

## 2022-07-12 DIAGNOSIS — I10 Essential (primary) hypertension: Secondary | ICD-10-CM | POA: Diagnosis not present

## 2022-07-12 DIAGNOSIS — M109 Gout, unspecified: Secondary | ICD-10-CM | POA: Diagnosis not present

## 2022-07-12 DIAGNOSIS — E1169 Type 2 diabetes mellitus with other specified complication: Secondary | ICD-10-CM | POA: Diagnosis not present

## 2022-07-14 LAB — COLOGUARD: COLOGUARD: NEGATIVE

## 2022-08-02 DIAGNOSIS — H2513 Age-related nuclear cataract, bilateral: Secondary | ICD-10-CM | POA: Diagnosis not present

## 2022-08-02 DIAGNOSIS — E113413 Type 2 diabetes mellitus with severe nonproliferative diabetic retinopathy with macular edema, bilateral: Secondary | ICD-10-CM | POA: Diagnosis not present

## 2022-08-02 DIAGNOSIS — H43823 Vitreomacular adhesion, bilateral: Secondary | ICD-10-CM | POA: Diagnosis not present

## 2022-08-02 DIAGNOSIS — H35033 Hypertensive retinopathy, bilateral: Secondary | ICD-10-CM | POA: Diagnosis not present

## 2022-08-09 DIAGNOSIS — H25013 Cortical age-related cataract, bilateral: Secondary | ICD-10-CM | POA: Diagnosis not present

## 2022-08-09 DIAGNOSIS — I10 Essential (primary) hypertension: Secondary | ICD-10-CM | POA: Diagnosis not present

## 2022-08-09 DIAGNOSIS — H18413 Arcus senilis, bilateral: Secondary | ICD-10-CM | POA: Diagnosis not present

## 2022-08-09 DIAGNOSIS — H25043 Posterior subcapsular polar age-related cataract, bilateral: Secondary | ICD-10-CM | POA: Diagnosis not present

## 2022-08-09 DIAGNOSIS — E119 Type 2 diabetes mellitus without complications: Secondary | ICD-10-CM | POA: Diagnosis not present

## 2022-08-09 DIAGNOSIS — H2513 Age-related nuclear cataract, bilateral: Secondary | ICD-10-CM | POA: Diagnosis not present

## 2022-08-09 DIAGNOSIS — H2512 Age-related nuclear cataract, left eye: Secondary | ICD-10-CM | POA: Diagnosis not present

## 2022-08-20 DIAGNOSIS — E119 Type 2 diabetes mellitus without complications: Secondary | ICD-10-CM | POA: Diagnosis not present

## 2022-08-20 DIAGNOSIS — I10 Essential (primary) hypertension: Secondary | ICD-10-CM | POA: Diagnosis not present

## 2022-10-11 DIAGNOSIS — I10 Essential (primary) hypertension: Secondary | ICD-10-CM | POA: Diagnosis not present

## 2022-10-11 DIAGNOSIS — M109 Gout, unspecified: Secondary | ICD-10-CM | POA: Diagnosis not present

## 2022-10-11 DIAGNOSIS — E785 Hyperlipidemia, unspecified: Secondary | ICD-10-CM | POA: Diagnosis not present

## 2022-10-11 DIAGNOSIS — E6609 Other obesity due to excess calories: Secondary | ICD-10-CM | POA: Diagnosis not present

## 2022-10-11 DIAGNOSIS — R600 Localized edema: Secondary | ICD-10-CM | POA: Diagnosis not present

## 2022-10-11 DIAGNOSIS — E1169 Type 2 diabetes mellitus with other specified complication: Secondary | ICD-10-CM | POA: Diagnosis not present

## 2022-10-17 DIAGNOSIS — E113412 Type 2 diabetes mellitus with severe nonproliferative diabetic retinopathy with macular edema, left eye: Secondary | ICD-10-CM | POA: Diagnosis not present

## 2022-10-31 DIAGNOSIS — H2512 Age-related nuclear cataract, left eye: Secondary | ICD-10-CM | POA: Diagnosis not present

## 2022-11-01 DIAGNOSIS — E113491 Type 2 diabetes mellitus with severe nonproliferative diabetic retinopathy without macular edema, right eye: Secondary | ICD-10-CM | POA: Diagnosis not present

## 2022-11-01 DIAGNOSIS — H2511 Age-related nuclear cataract, right eye: Secondary | ICD-10-CM | POA: Diagnosis not present

## 2022-11-01 DIAGNOSIS — E113412 Type 2 diabetes mellitus with severe nonproliferative diabetic retinopathy with macular edema, left eye: Secondary | ICD-10-CM | POA: Diagnosis not present

## 2022-11-01 DIAGNOSIS — H25013 Cortical age-related cataract, bilateral: Secondary | ICD-10-CM | POA: Diagnosis not present

## 2022-11-01 DIAGNOSIS — H2512 Age-related nuclear cataract, left eye: Secondary | ICD-10-CM | POA: Diagnosis not present

## 2022-11-01 DIAGNOSIS — H25043 Posterior subcapsular polar age-related cataract, bilateral: Secondary | ICD-10-CM | POA: Diagnosis not present

## 2022-11-01 DIAGNOSIS — H52222 Regular astigmatism, left eye: Secondary | ICD-10-CM | POA: Diagnosis not present

## 2022-11-01 DIAGNOSIS — H40053 Ocular hypertension, bilateral: Secondary | ICD-10-CM | POA: Diagnosis not present

## 2022-11-25 DIAGNOSIS — E113311 Type 2 diabetes mellitus with moderate nonproliferative diabetic retinopathy with macular edema, right eye: Secondary | ICD-10-CM | POA: Diagnosis not present

## 2022-11-28 DIAGNOSIS — H2511 Age-related nuclear cataract, right eye: Secondary | ICD-10-CM | POA: Diagnosis not present

## 2022-11-29 DIAGNOSIS — H25043 Posterior subcapsular polar age-related cataract, bilateral: Secondary | ICD-10-CM | POA: Diagnosis not present

## 2022-11-29 DIAGNOSIS — H2512 Age-related nuclear cataract, left eye: Secondary | ICD-10-CM | POA: Diagnosis not present

## 2022-11-29 DIAGNOSIS — H2511 Age-related nuclear cataract, right eye: Secondary | ICD-10-CM | POA: Diagnosis not present

## 2022-11-29 DIAGNOSIS — H40053 Ocular hypertension, bilateral: Secondary | ICD-10-CM | POA: Diagnosis not present

## 2022-11-29 DIAGNOSIS — H25013 Cortical age-related cataract, bilateral: Secondary | ICD-10-CM | POA: Diagnosis not present

## 2022-12-20 DIAGNOSIS — H35033 Hypertensive retinopathy, bilateral: Secondary | ICD-10-CM | POA: Diagnosis not present

## 2022-12-20 DIAGNOSIS — H43823 Vitreomacular adhesion, bilateral: Secondary | ICD-10-CM | POA: Diagnosis not present

## 2022-12-20 DIAGNOSIS — E113413 Type 2 diabetes mellitus with severe nonproliferative diabetic retinopathy with macular edema, bilateral: Secondary | ICD-10-CM | POA: Diagnosis not present

## 2022-12-27 DIAGNOSIS — Z0100 Encounter for examination of eyes and vision without abnormal findings: Secondary | ICD-10-CM | POA: Diagnosis not present

## 2023-01-17 DIAGNOSIS — R600 Localized edema: Secondary | ICD-10-CM | POA: Diagnosis not present

## 2023-01-17 DIAGNOSIS — I10 Essential (primary) hypertension: Secondary | ICD-10-CM | POA: Diagnosis not present

## 2023-01-17 DIAGNOSIS — Z6841 Body Mass Index (BMI) 40.0 and over, adult: Secondary | ICD-10-CM | POA: Diagnosis not present

## 2023-01-17 DIAGNOSIS — E785 Hyperlipidemia, unspecified: Secondary | ICD-10-CM | POA: Diagnosis not present

## 2023-01-17 DIAGNOSIS — E1169 Type 2 diabetes mellitus with other specified complication: Secondary | ICD-10-CM | POA: Diagnosis not present

## 2023-03-27 DIAGNOSIS — H35033 Hypertensive retinopathy, bilateral: Secondary | ICD-10-CM | POA: Diagnosis not present

## 2023-03-27 DIAGNOSIS — H43823 Vitreomacular adhesion, bilateral: Secondary | ICD-10-CM | POA: Diagnosis not present

## 2023-03-27 DIAGNOSIS — E113413 Type 2 diabetes mellitus with severe nonproliferative diabetic retinopathy with macular edema, bilateral: Secondary | ICD-10-CM | POA: Diagnosis not present

## 2023-03-27 DIAGNOSIS — H40023 Open angle with borderline findings, high risk, bilateral: Secondary | ICD-10-CM | POA: Diagnosis not present

## 2023-04-18 DIAGNOSIS — R600 Localized edema: Secondary | ICD-10-CM | POA: Diagnosis not present

## 2023-04-18 DIAGNOSIS — E785 Hyperlipidemia, unspecified: Secondary | ICD-10-CM | POA: Diagnosis not present

## 2023-04-18 DIAGNOSIS — Z6841 Body Mass Index (BMI) 40.0 and over, adult: Secondary | ICD-10-CM | POA: Diagnosis not present

## 2023-04-18 DIAGNOSIS — R972 Elevated prostate specific antigen [PSA]: Secondary | ICD-10-CM | POA: Diagnosis not present

## 2023-04-18 DIAGNOSIS — I10 Essential (primary) hypertension: Secondary | ICD-10-CM | POA: Diagnosis not present

## 2023-04-18 DIAGNOSIS — E7800 Pure hypercholesterolemia, unspecified: Secondary | ICD-10-CM | POA: Diagnosis not present

## 2023-04-18 DIAGNOSIS — E6609 Other obesity due to excess calories: Secondary | ICD-10-CM | POA: Diagnosis not present

## 2023-04-18 DIAGNOSIS — E1169 Type 2 diabetes mellitus with other specified complication: Secondary | ICD-10-CM | POA: Diagnosis not present

## 2023-04-26 DIAGNOSIS — C61 Malignant neoplasm of prostate: Secondary | ICD-10-CM | POA: Diagnosis not present

## 2023-04-26 DIAGNOSIS — E119 Type 2 diabetes mellitus without complications: Secondary | ICD-10-CM | POA: Diagnosis not present

## 2023-04-26 DIAGNOSIS — Z6841 Body Mass Index (BMI) 40.0 and over, adult: Secondary | ICD-10-CM | POA: Diagnosis not present

## 2023-06-24 DIAGNOSIS — C61 Malignant neoplasm of prostate: Secondary | ICD-10-CM | POA: Diagnosis not present

## 2023-06-28 DIAGNOSIS — H524 Presbyopia: Secondary | ICD-10-CM | POA: Diagnosis not present

## 2023-06-28 DIAGNOSIS — Z961 Presence of intraocular lens: Secondary | ICD-10-CM | POA: Diagnosis not present

## 2023-06-28 DIAGNOSIS — H40053 Ocular hypertension, bilateral: Secondary | ICD-10-CM | POA: Diagnosis not present

## 2023-06-29 DIAGNOSIS — R9389 Abnormal findings on diagnostic imaging of other specified body structures: Secondary | ICD-10-CM | POA: Diagnosis not present

## 2023-06-29 DIAGNOSIS — R972 Elevated prostate specific antigen [PSA]: Secondary | ICD-10-CM | POA: Diagnosis not present

## 2023-06-29 DIAGNOSIS — N5089 Other specified disorders of the male genital organs: Secondary | ICD-10-CM | POA: Diagnosis not present

## 2023-07-25 DIAGNOSIS — C61 Malignant neoplasm of prostate: Secondary | ICD-10-CM | POA: Diagnosis not present

## 2023-07-25 DIAGNOSIS — R600 Localized edema: Secondary | ICD-10-CM | POA: Diagnosis not present

## 2023-07-25 DIAGNOSIS — I10 Essential (primary) hypertension: Secondary | ICD-10-CM | POA: Diagnosis not present

## 2023-07-25 DIAGNOSIS — E1169 Type 2 diabetes mellitus with other specified complication: Secondary | ICD-10-CM | POA: Diagnosis not present

## 2023-07-25 DIAGNOSIS — E7800 Pure hypercholesterolemia, unspecified: Secondary | ICD-10-CM | POA: Diagnosis not present

## 2023-07-25 DIAGNOSIS — E669 Obesity, unspecified: Secondary | ICD-10-CM | POA: Diagnosis not present

## 2023-09-18 DIAGNOSIS — R972 Elevated prostate specific antigen [PSA]: Secondary | ICD-10-CM | POA: Diagnosis not present

## 2023-09-18 DIAGNOSIS — Z7984 Long term (current) use of oral hypoglycemic drugs: Secondary | ICD-10-CM | POA: Diagnosis not present

## 2023-09-18 DIAGNOSIS — C61 Malignant neoplasm of prostate: Secondary | ICD-10-CM | POA: Diagnosis not present

## 2023-09-18 DIAGNOSIS — N411 Chronic prostatitis: Secondary | ICD-10-CM | POA: Diagnosis not present

## 2023-09-18 DIAGNOSIS — Z7982 Long term (current) use of aspirin: Secondary | ICD-10-CM | POA: Diagnosis not present

## 2023-09-18 DIAGNOSIS — I10 Essential (primary) hypertension: Secondary | ICD-10-CM | POA: Diagnosis not present

## 2023-09-18 DIAGNOSIS — E119 Type 2 diabetes mellitus without complications: Secondary | ICD-10-CM | POA: Diagnosis not present

## 2023-09-18 DIAGNOSIS — Z6841 Body Mass Index (BMI) 40.0 and over, adult: Secondary | ICD-10-CM | POA: Diagnosis not present

## 2023-09-22 DIAGNOSIS — H524 Presbyopia: Secondary | ICD-10-CM | POA: Diagnosis not present

## 2023-09-22 DIAGNOSIS — H40053 Ocular hypertension, bilateral: Secondary | ICD-10-CM | POA: Diagnosis not present

## 2023-09-22 DIAGNOSIS — H0016 Chalazion left eye, unspecified eyelid: Secondary | ICD-10-CM | POA: Diagnosis not present

## 2023-09-22 DIAGNOSIS — Z961 Presence of intraocular lens: Secondary | ICD-10-CM | POA: Diagnosis not present

## 2023-10-23 DIAGNOSIS — E1169 Type 2 diabetes mellitus with other specified complication: Secondary | ICD-10-CM | POA: Diagnosis not present

## 2023-10-23 DIAGNOSIS — I10 Essential (primary) hypertension: Secondary | ICD-10-CM | POA: Diagnosis not present

## 2023-10-23 DIAGNOSIS — E7800 Pure hypercholesterolemia, unspecified: Secondary | ICD-10-CM | POA: Diagnosis not present

## 2023-10-23 DIAGNOSIS — R609 Edema, unspecified: Secondary | ICD-10-CM | POA: Diagnosis not present

## 2023-10-23 DIAGNOSIS — C61 Malignant neoplasm of prostate: Secondary | ICD-10-CM | POA: Diagnosis not present

## 2023-10-23 DIAGNOSIS — R600 Localized edema: Secondary | ICD-10-CM | POA: Diagnosis not present

## 2023-10-23 DIAGNOSIS — E6609 Other obesity due to excess calories: Secondary | ICD-10-CM | POA: Diagnosis not present

## 2023-10-30 DIAGNOSIS — I10 Essential (primary) hypertension: Secondary | ICD-10-CM | POA: Diagnosis not present

## 2023-10-30 DIAGNOSIS — E1169 Type 2 diabetes mellitus with other specified complication: Secondary | ICD-10-CM | POA: Diagnosis not present

## 2023-12-25 DIAGNOSIS — I10 Essential (primary) hypertension: Secondary | ICD-10-CM | POA: Diagnosis not present

## 2023-12-25 DIAGNOSIS — E1169 Type 2 diabetes mellitus with other specified complication: Secondary | ICD-10-CM | POA: Diagnosis not present

## 2023-12-25 DIAGNOSIS — E663 Overweight: Secondary | ICD-10-CM | POA: Diagnosis not present

## 2023-12-27 DIAGNOSIS — H0016 Chalazion left eye, unspecified eyelid: Secondary | ICD-10-CM | POA: Diagnosis not present

## 2023-12-27 DIAGNOSIS — H40053 Ocular hypertension, bilateral: Secondary | ICD-10-CM | POA: Diagnosis not present

## 2023-12-27 DIAGNOSIS — H524 Presbyopia: Secondary | ICD-10-CM | POA: Diagnosis not present

## 2023-12-27 DIAGNOSIS — Z961 Presence of intraocular lens: Secondary | ICD-10-CM | POA: Diagnosis not present

## 2024-03-25 DIAGNOSIS — C61 Malignant neoplasm of prostate: Secondary | ICD-10-CM | POA: Diagnosis not present

## 2024-04-05 DIAGNOSIS — H40053 Ocular hypertension, bilateral: Secondary | ICD-10-CM | POA: Diagnosis not present

## 2024-04-05 DIAGNOSIS — Z961 Presence of intraocular lens: Secondary | ICD-10-CM | POA: Diagnosis not present

## 2024-04-05 DIAGNOSIS — E113412 Type 2 diabetes mellitus with severe nonproliferative diabetic retinopathy with macular edema, left eye: Secondary | ICD-10-CM | POA: Diagnosis not present

## 2024-04-05 DIAGNOSIS — H524 Presbyopia: Secondary | ICD-10-CM | POA: Diagnosis not present

## 2024-04-05 DIAGNOSIS — H5202 Hypermetropia, left eye: Secondary | ICD-10-CM | POA: Diagnosis not present

## 2024-04-05 DIAGNOSIS — H52223 Regular astigmatism, bilateral: Secondary | ICD-10-CM | POA: Diagnosis not present

## 2024-04-11 DIAGNOSIS — C61 Malignant neoplasm of prostate: Secondary | ICD-10-CM | POA: Diagnosis not present

## 2024-05-06 DIAGNOSIS — E7800 Pure hypercholesterolemia, unspecified: Secondary | ICD-10-CM | POA: Diagnosis not present

## 2024-05-06 DIAGNOSIS — I10 Essential (primary) hypertension: Secondary | ICD-10-CM | POA: Diagnosis not present

## 2024-05-06 DIAGNOSIS — M109 Gout, unspecified: Secondary | ICD-10-CM | POA: Diagnosis not present

## 2024-05-06 DIAGNOSIS — E1169 Type 2 diabetes mellitus with other specified complication: Secondary | ICD-10-CM | POA: Diagnosis not present

## 2024-05-06 DIAGNOSIS — C61 Malignant neoplasm of prostate: Secondary | ICD-10-CM | POA: Diagnosis not present

## 2024-07-10 DIAGNOSIS — Z08 Encounter for follow-up examination after completed treatment for malignant neoplasm: Secondary | ICD-10-CM | POA: Diagnosis not present

## 2024-07-10 DIAGNOSIS — C61 Malignant neoplasm of prostate: Secondary | ICD-10-CM | POA: Diagnosis not present

## 2024-07-10 DIAGNOSIS — Z8546 Personal history of malignant neoplasm of prostate: Secondary | ICD-10-CM | POA: Diagnosis not present

## 2024-07-16 DIAGNOSIS — C61 Malignant neoplasm of prostate: Secondary | ICD-10-CM | POA: Diagnosis not present

## 2024-07-29 DIAGNOSIS — M109 Gout, unspecified: Secondary | ICD-10-CM | POA: Diagnosis not present

## 2024-07-29 DIAGNOSIS — E1169 Type 2 diabetes mellitus with other specified complication: Secondary | ICD-10-CM | POA: Diagnosis not present

## 2024-07-29 DIAGNOSIS — E7800 Pure hypercholesterolemia, unspecified: Secondary | ICD-10-CM | POA: Diagnosis not present

## 2024-07-29 DIAGNOSIS — I10 Essential (primary) hypertension: Secondary | ICD-10-CM | POA: Diagnosis not present

## 2024-07-29 DIAGNOSIS — C61 Malignant neoplasm of prostate: Secondary | ICD-10-CM | POA: Diagnosis not present

## 2024-08-07 ENCOUNTER — Ambulatory Visit: Admitting: Podiatry

## 2024-08-07 DIAGNOSIS — M79674 Pain in right toe(s): Secondary | ICD-10-CM

## 2024-08-07 DIAGNOSIS — B351 Tinea unguium: Secondary | ICD-10-CM | POA: Diagnosis not present

## 2024-08-07 DIAGNOSIS — M79675 Pain in left toe(s): Secondary | ICD-10-CM | POA: Diagnosis not present

## 2024-08-07 NOTE — Progress Notes (Signed)
  Subjective:  Patient ID: Jerome Serrano, male    DOB: 1951-01-15,  MRN: [id number]  Chief Complaint  Patient presents with   Nail Problem    Pt stated that he is a diabetic and he has some neuropathy he would like to have his nails trimmed     73 y.o. male returns for the above complaint.  Patient presents with thick elongated Shogry mycotic toenails x 10 mild pain on palpation hurts with ambulation and shoe pressure.  He  Objective:  There were no vitals filed for this visit. Podiatric Exam: Vascular: dorsalis pedis and posterior tibial pulses are palpable bilateral. Capillary return is immediate. Temperature gradient is WNL. Skin turgor WNL  Sensorium: Normal Semmes Weinstein monofilament test. Normal tactile sensation bilaterally. Nail Exam: Pt has thick disfigured discolored nails with subungual debris noted bilateral entire nail hallux through fifth toenails.  Pain on palpation to the nails. Ulcer Exam: There is no evidence of ulcer or pre-ulcerative changes or infection. Orthopedic Exam: Muscle tone and strength are WNL. No limitations in general ROM. No crepitus or effusions noted.  Skin: No Porokeratosis. No infection or ulcers    Assessment & Plan:   1. Pain due to onychomycosis of toenails of both feet     Patient was evaluated and treated and all questions answered.  Onychomycosis with pain  -Nails palliatively debrided as below. -Educated on self-care  Procedure: Nail Debridement Rationale: pain  Type of Debridement: manual, sharp debridement. Instrumentation: Nail nipper, rotary burr. Number of Nails: 10  Procedures and Treatment: Consent by patient was obtained for treatment procedures. The patient understood the discussion of treatment and procedures well. All questions were answered thoroughly reviewed. Debridement of mycotic and hypertrophic toenails, 1 through 5 bilateral and clearing of subungual debris. No ulceration, no infection noted.  Return  Visit-Office Procedure: Patient instructed to return to the office for a follow up visit 3 months for continued evaluation and treatment.  Franky Blanch, DPM    Return in about 3 months (around 11/06/2024) for [facility].

## 2024-08-21 DIAGNOSIS — C61 Malignant neoplasm of prostate: Secondary | ICD-10-CM | POA: Diagnosis not present

## 2024-10-08 NOTE — Progress Notes (Signed)
 Encounter to refer Jerome Serrano for radiation treatment closer to home per patient request.

## 2024-10-10 ENCOUNTER — Other Ambulatory Visit: Payer: Self-pay | Admitting: Radiation Oncology

## 2024-10-10 ENCOUNTER — Inpatient Hospital Stay
Admission: RE | Admit: 2024-10-10 | Discharge: 2024-10-10 | Disposition: A | Payer: Self-pay | Source: Ambulatory Visit | Attending: Radiation Oncology | Admitting: Radiation Oncology

## 2024-10-10 ENCOUNTER — Telehealth: Payer: Self-pay | Admitting: Radiation Oncology

## 2024-10-10 ENCOUNTER — Inpatient Hospital Stay
Admission: RE | Admit: 2024-10-10 | Discharge: 2024-10-10 | Disposition: A | Discharge status: Home / Self Care | Payer: Self-pay | Source: Ambulatory Visit | Attending: Radiation Oncology | Admitting: Radiation Oncology

## 2024-10-10 DIAGNOSIS — C61 Malignant neoplasm of prostate: Secondary | ICD-10-CM

## 2024-10-10 NOTE — Telephone Encounter (Signed)
 11/20 Sent via stat fax request for all recent images to be pushed to powershare from [facility].  Waiting on images.

## 2024-10-11 ENCOUNTER — Telehealth: Payer: Self-pay | Admitting: Radiation Oncology

## 2024-10-11 NOTE — Telephone Encounter (Signed)
 11/21 Left voicemail with [place] for recent PET/CT and MRI images to be attached to patient's timeline in epic.  Waiting on images.

## 2024-10-14 ENCOUNTER — Telehealth: Payer: Self-pay | Admitting: Radiation Oncology

## 2024-10-14 NOTE — Telephone Encounter (Signed)
 11/24 Follow up call to [place] spoke to [place].  Working on uploading recent PET/CT/MRI images to epic.  Waiting on images.

## 2024-10-15 NOTE — Progress Notes (Addendum)
 GU Location of Tumor / Histology: Prostate Ca  If Prostate Cancer, Gleason Score is (3 + 3) and PSA is (20.16 on 08/21/2024)  PSA  22.12 on 03/25/2024  Jerome Serrano presented as referral from Dr. Leveda A. Frizzell ([facility]) elevated PSA.  Biopsies     Past/Anticipated interventions by urology, if any:    Past/Anticipated interventions by medical oncology, if any: NA  Weight changes, if any: No  IPSS:  13 SHIM:  5  Bowel/Bladder complaints, if any:  No  Nausea/Vomiting, if any:  No  Pain issues, if any:  0/10  SAFETY ISSUES: Prior radiation? No Pacemaker/ICD? No Possible current pregnancy? Male Is the patient on methotrexate? No  Current Complaints / other details:  None  30  minutes spent total, including time for meaningful use questions, reviewing medication, as well as spent in face-to-face time in nurse evaluation with the patient.

## 2024-10-21 DIAGNOSIS — C61 Malignant neoplasm of prostate: Secondary | ICD-10-CM | POA: Diagnosis not present

## 2024-10-22 ENCOUNTER — Ambulatory Visit
Admission: RE | Admit: 2024-10-22 | Discharge: 2024-10-22 | Disposition: A | Source: Ambulatory Visit | Attending: Radiation Oncology

## 2024-10-22 ENCOUNTER — Ambulatory Visit
Admission: RE | Admit: 2024-10-22 | Discharge: 2024-10-22 | Disposition: A | Discharge status: Home / Self Care | Source: Ambulatory Visit | Attending: Radiation Oncology | Admitting: Radiation Oncology

## 2024-10-22 ENCOUNTER — Telehealth: Payer: Self-pay

## 2024-10-22 ENCOUNTER — Telehealth: Payer: Self-pay | Admitting: Radiation Oncology

## 2024-10-22 ENCOUNTER — Encounter: Payer: Self-pay | Admitting: Radiation Oncology

## 2024-10-22 ENCOUNTER — Ambulatory Visit
Admission: RE | Admit: 2024-10-22 | Discharge: 2024-10-22 | Disposition: A | Source: Ambulatory Visit | Attending: Radiation Oncology | Admitting: Radiation Oncology

## 2024-10-22 VITALS — BP 158/78 | HR 81 | Temp 97.6°F | Resp 20 | Ht 68.0 in | Wt 292.6 lb

## 2024-10-22 DIAGNOSIS — Z803 Family history of malignant neoplasm of breast: Secondary | ICD-10-CM | POA: Diagnosis not present

## 2024-10-22 DIAGNOSIS — C61 Malignant neoplasm of prostate: Secondary | ICD-10-CM

## 2024-10-22 DIAGNOSIS — Z7982 Long term (current) use of aspirin: Secondary | ICD-10-CM | POA: Diagnosis not present

## 2024-10-22 DIAGNOSIS — Z7984 Long term (current) use of oral hypoglycemic drugs: Secondary | ICD-10-CM | POA: Diagnosis not present

## 2024-10-22 DIAGNOSIS — E669 Obesity, unspecified: Secondary | ICD-10-CM | POA: Diagnosis not present

## 2024-10-22 DIAGNOSIS — E119 Type 2 diabetes mellitus without complications: Secondary | ICD-10-CM | POA: Diagnosis not present

## 2024-10-22 DIAGNOSIS — Z191 Hormone sensitive malignancy status: Secondary | ICD-10-CM | POA: Diagnosis not present

## 2024-10-22 DIAGNOSIS — Z800 Family history of malignant neoplasm of digestive organs: Secondary | ICD-10-CM | POA: Diagnosis not present

## 2024-10-22 DIAGNOSIS — Z79899 Other long term (current) drug therapy: Secondary | ICD-10-CM | POA: Diagnosis not present

## 2024-10-22 DIAGNOSIS — I10 Essential (primary) hypertension: Secondary | ICD-10-CM | POA: Diagnosis not present

## 2024-10-22 LAB — PSA: Prostatic Specific Antigen: 19.34 ng/mL — ABNORMAL HIGH (ref 0.00–4.00)

## 2024-10-22 NOTE — Telephone Encounter (Signed)
 12/2 Follow up call to [place], spoke to [place], not seeing images now. Will follow up with [facility].  Waiting on images.

## 2024-10-22 NOTE — Progress Notes (Signed)
 Radiation Oncology         [phone] ________________________________  Initial Outpatient Consultation  Name: Jerome Serrano MRN: [id number]  Date: 10/22/2024  DOB: Apr 19, 1951  RR:Ypoo, Elna, MD  Rhetta Leveda Redbird, MD   REFERRING PHYSICIAN: Rhetta Leveda Redbird, MD  DIAGNOSIS: 73 y.o. gentleman with Stage T1c adenocarcinoma of the prostate with Gleason score of 3+3, and PSA of 20.16.    ICD-10-CM   1. Malignant neoplasm of prostate (HCC)  C61       HISTORY OF PRESENT ILLNESS: Jerome Serrano is a 73 y.o. male with a diagnosis of prostate cancer. He has been followed by urology since at least 11/08/2012 for a history of an elevated PSA. He was initially diagnosed with Gleason 3+3 prostatic adenocarcinoma on TRUSPBx 09/16/14 under the care of Dr. Cam with a PSA of 7.1. He was referred to Dr. Shannon in 11/2014 and following an informed discussion, he elected to pursue active surveillance. Since that time, it appears he was lost to follow up with urology until 2023/11/09, when he was referred back to Dr. Narvis for evaluation due to a rise in his PSA to 12.84. He underwent a restaging prostate MRI on 06/24/23 showing a PI-RADS 4 lesion in the right peripheral zone. He was subsequently taken for repeat prostate biopsy on 09/18/23 and was found to have low volume Gleason 3+3 prostate cancer in 2 of 16 core biopsies. He opted to continue active surveillance, but his PSA has continued to rise. His PSA increased to 22.12 in 03/2024, prompting restaging with a PSMA PET scan that was performed on 07/10/24 showing no evidence of disease outside of the prostate. A repeat PSA in 08/2024 remained elevated at 20.16. He met with Dr. Frizzell on 08/21/24 to discuss treatment options and the recommendation was to proceed with ADT concurrent with IMRT prostate and pelvic lymph nodes/  The patient lives in [place] and prefers to have his daily radiation treatments closer to home so he has kindly been referred  today for further discussion of potential radiation treatment options.   PREVIOUS RADIATION THERAPY: No  PAST MEDICAL HISTORY:  Past Medical History:  Diagnosis Date   Diabetes mellitus    Hypertension    Obesity    Prostate cancer (HCC)       PAST SURGICAL HISTORY: Past Surgical History:  Procedure Laterality Date   PROSTATE BIOPSY  2024/11/08    FAMILY HISTORY:  Family History  Problem Relation Age of Onset   Breast cancer Mother        passed away in 11/08/1994   Colon cancer Father    Diabetes Brother     SOCIAL HISTORY:  Social History   Socioeconomic History   Marital status: Legally Separated    Spouse name: Not on file   Number of children: 2   Years of education: Not on file   Highest education level: Not on file  Occupational History   Occupation: Buyer, Retail: UNEMPLOYED  Tobacco Use   Smoking status: Never   Smokeless tobacco: Never  Substance and Sexual Activity   Alcohol use: No   Drug use: Not on file   Sexual activity: Not on file  Other Topics Concern   Not on file  Social History Narrative   Exercise weekly for 30 minutes   Social Drivers of Health   Financial Resource Strain: Not on file  Food Insecurity: No Food Insecurity (10/22/2024)   Hunger Vital Sign    Worried About  Running Out of Food in the Last Year: Never true    Ran Out of Food in the Last Year: Never true  Transportation Needs: No Transportation Needs (10/22/2024)   PRAPARE - Administrator, Civil Service (Medical): No    Lack of Transportation (Non-Medical): No  Physical Activity: Not on file  Stress: Not on file  Social Connections: Not on file  Intimate Partner Violence: Not At Risk (10/22/2024)   Humiliation, Afraid, Rape, and Kick questionnaire    Fear of Current or Ex-Partner: No    Emotionally Abused: No    Physically Abused: No    Sexually Abused: No    ALLERGIES: Lactose intolerance (gi)  MEDICATIONS:  Current Outpatient Medications  Medication  Sig Dispense Refill   colchicine 0.6 MG tablet Take 0.6 mg by mouth as needed.     STATUS COVID-19/FLU A&B KIT TEST AS DIRECTED TODAY     valsartan (DIOVAN) 320 MG tablet Take 320 mg by mouth daily.     acetaminophen-codeine (TYLENOL #3) 300-30 MG tablet Take 1 tablet by mouth at bedtime as needed.     allopurinol (ZYLOPRIM) 100 MG tablet Take 100 mg by mouth daily.     amLODipine (NORVASC) 10 MG tablet Take 10 mg by mouth at bedtime.     aspirin 81 MG tablet Take 81 mg by mouth daily.     atorvastatin (LIPITOR) 10 MG tablet Take 10 mg by mouth at bedtime.     furosemide (LASIX) 20 MG tablet Take 20 mg by mouth as needed for edema or fluid.     gabapentin (NEURONTIN) 300 MG capsule Take 300 mg by mouth 2 (two) times daily.     glipiZIDE  (GLUCOTROL  XL) 10 MG 24 hr tablet Take 10 mg by mouth every morning.     hydrALAZINE (APRESOLINE) 100 MG tablet Take 100 mg by mouth 2 (two) times daily.     metFORMIN  (GLUCOPHAGE ) 1000 MG tablet Take 1 tablet (1,000 mg total) by mouth 2 (two) times daily with a meal. PATIENT NEEDS OFFICE VISIT FOR ADDITIONAL REFILLS (Patient taking differently: Take 1,000 mg by mouth daily with breakfast. PATIENT NEEDS OFFICE VISIT FOR ADDITIONAL REFILLS) 60 tablet 0   potassium chloride (KLOR-CON M) 10 MEQ tablet Take 10 mEq by mouth as needed.     No current facility-administered medications for this encounter.    REVIEW OF SYSTEMS:  On review of systems, the patient reports that he is doing well overall. He denies any chest pain, shortness of breath, cough, fevers, chills, night sweats, unintended weight changes. He denies any bowel disturbances, and denies abdominal pain, nausea or vomiting. He denies any new musculoskeletal or joint aches or pains. His IPSS was 5, indicating minimal urinary symptoms. His SHIM was 13, indicating he has mild erectile dysfunction. A complete review of systems is obtained and is otherwise negative.   PHYSICAL EXAM:  Wt Readings from Last 3  Encounters:  10/22/24 292 lb 9.6 oz (132.7 kg)  12/10/14 286 lb 1.6 oz (129.8 kg)  01/09/13 277 lb 9.6 oz (125.9 kg)   Temp Readings from Last 3 Encounters:  10/22/24 97.6 F (36.4 C)  12/10/14 97.7 F (36.5 C) (Oral)  01/09/13 98.5 F (36.9 C) (Oral)   BP Readings from Last 3 Encounters:  10/22/24 (!) 158/78  12/10/14 (!) 157/74  01/09/13 (!) 146/90   Pulse Readings from Last 3 Encounters:  10/22/24 81  12/10/14 71  01/09/13 97   Pain Assessment Pain Score: 0-No pain/10  In general this is a well appearing African American male in no acute distress. He's alert and oriented x4 and appropriate throughout the examination. Cardiopulmonary assessment is negative for acute distress, and he exhibits normal effort.     KPS = 100  100 - Normal; no complaints; no evidence of disease. 90   - Able to carry on normal activity; minor signs or symptoms of disease. 80   - Normal activity with effort; some signs or symptoms of disease. 41   - Cares for self; unable to carry on normal activity or to do active work. 60   - Requires occasional assistance, but is able to care for most of his personal needs. 50   - Requires considerable assistance and frequent medical care. 40   - Disabled; requires special care and assistance. 30   - Severely disabled; hospital admission is indicated although death not imminent. 20   - Very sick; hospital admission necessary; active supportive treatment necessary. 10   - Moribund; fatal processes progressing rapidly. 0     - Dead  Karnofsky DA, Abelmann WH, Craver LS and Burchenal [facility] [phone]) The use of the nitrogen mustards in the palliative treatment of carcinoma: with particular reference to bronchogenic carcinoma Cancer 1 [id number]  LABORATORY DATA:  Lab Results  Component Value Date   WBC 5.9 01/09/2013   HGB 14.9 01/09/2013   HCT 44.5 01/09/2013   MCV 83.5 01/09/2013   PLT 260 01/09/2013   Lab Results  Component Value Date   NA 134 (L) 01/09/2013    K 4.1 01/09/2013   CL 98 01/09/2013   CO2 26 01/09/2013   Lab Results  Component Value Date   ALT 190 (H) 01/09/2013   AST 109 (H) 01/09/2013   ALKPHOS 174 (H) 01/09/2013   BILITOT 0.9 01/09/2013     RADIOGRAPHY: No results found.    IMPRESSION/PLAN: 1. 73 y.o. gentleman with Stage T1c adenocarcinoma of the prostate with Gleason Score of 3+3, and PSA of 20.16.  We discussed the patient's workup and outlined the nature of prostate cancer in this setting. The patient's T stage, Gleason's score, and PSA put him into the high risk group. Accordingly, he is eligible for a variety of potential treatment options including prostatectomy or LT ADT concurrent with either 8 weeks of external radiation or 5 weeks of external radiation with an upfront brachytherapy boost. We discussed the available radiation techniques, and focused on the details and logistics of delivery. We discussed and outlined the risks, benefits, short and long-term effects associated with radiotherapy and compared and contrasted these with prostatectomy. We discussed the role of SpaceOAR gel in reducing the rectal toxicity associated with radiotherapy. We also detailed the role of ADT in the treatment of high risk prostate cancer and outlined the associated side effects that could be expected with this therapy.  He appears to have a good understanding of his disease and our treatment recommendations which are of curative intent.  He was encouraged to ask questions that were answered to his stated satisfaction.  At the conclusion of our conversation, the patient is interested in moving forward with the recommended 8 weeks of daily IMRT concurrent with LT-ADT. He would like to have a repeat PSA today so we will place the order and call him with those results as soon as they are available. Unless the PSA has significantly decreased to under 10, he is in agreement to proceed as above so we will share our discussion with Dr. Tommi  and  make arrangements for a follow up visit in the urology office, first available to start ADT now. We will also inquire as to whether they will place fisucial markers and/or SpaceOAR gel. If they do not, we can forego the procedure and just proceed with CT SIM in February, 2026, in anticipation of beginning the daily radiation treatments approximately 2 months from start of ADT. We enjoyed meeting with him and his wife today and look forward to  continuing to participate in his care.  We personally spent 70 minutes in this encounter including chart review, reviewing radiological studies, meeting face-to-face with the patient, entering orders and completing documentation.    Sabra MICAEL Rusk, PA-C    Donnice Barge, MD  [facility]  Radiation Oncology Direct Dial: [phone]  Fax: [phone] [email or web address]  Skype  LinkedIn   This document serves as a record of services personally performed by Donnice Barge, MD and Sabra Rusk, PA-C. It was created on their behalf by Izetta Neither, a trained medical scribe. The creation of this record is based on the scribe's personal observations and the provider's statements to them. This document has been checked and approved by the attending provider.

## 2024-10-22 NOTE — Telephone Encounter (Signed)
 Was asked by Ashlyn to call patient for treatment estimate.  I called him and he was still here in a room so I went over and spoke with him.   I let him know his out of pocket max was $6750.00 and he has a remaining balance of $6365.00 that he would be responsible for.  Patient verbalized understanding

## 2024-10-22 NOTE — Telephone Encounter (Signed)
 [facility] Clinical Social Work  Clinical Social Work was referred by medical provider for assessment of psychosocial needs related to adjustment to diagnosis. Clinical Social Worker contacted patient by phone to offer support and assess for needs.  Patient confirmed recent feelings of being down and informed CSW of significant patient history of cancer. CSW provided emotional support and normalized expressed emotions. CSW informed patient of clinical services available through cancer center. Patient declined support services at this time.   Lizbeth Sprague, LCSW  Clinical Social Worker [facility]

## 2024-10-22 NOTE — Progress Notes (Signed)
 Introduced myself to the patient as the prostate nurse navigator. He is here to discuss his radiation treatment options and will proceed with ADT and IMRT. I provided patient with some written education and my direct contact information.  Patient knows to reach out with any questions or barriers that may arise.

## 2024-10-25 NOTE — Progress Notes (Signed)
 RN left message for call back for patient to review PSA results and finalize recommendations.    Message left with Dr. Pia office to inquire about ADT, fiducial marker's, and spaceOAR gel placement.  Will continue to follow.

## 2024-10-29 NOTE — Progress Notes (Unsigned)
 RN spoke with Jerome Serrano at [facility].   Need for ADT, fiducial marker's, and spaceOAR gel placement will be reviewed with MD.  Jerome Serrano will contact me for follow up regarding this.

## 2024-10-29 NOTE — Progress Notes (Signed)
 RN spoke with patient and reviewed PSA results.  Reviewed next steps with ADT at Urology, he is aware we are working to coordinate.  All questions answered, no additional needs at this time. Will continue to follow.

## 2024-10-31 NOTE — Telephone Encounter (Signed)
 Spoke with patient he stated he will call back to schedule surgery with Dr.Davis

## 2024-11-04 NOTE — Progress Notes (Addendum)
 Patient received Lupron 45mg  on 12/11 with Dr. Nicholaus.    Pending fiducial marker's, spaceOAR, and CT Simulation date at this time.  Will continue to follow.

## 2024-11-05 ENCOUNTER — Telehealth: Payer: Self-pay

## 2024-11-05 NOTE — Telephone Encounter (Signed)
 Just an FYI Patient called concerning his appointment. He says when the call cam through him to confirm he may have hit cancel. He IS going to keep his appointment.

## 2024-11-06 ENCOUNTER — Ambulatory Visit: Admitting: Podiatry

## 2024-11-06 DIAGNOSIS — M79675 Pain in left toe(s): Secondary | ICD-10-CM | POA: Diagnosis not present

## 2024-11-06 DIAGNOSIS — E1149 Type 2 diabetes mellitus with other diabetic neurological complication: Secondary | ICD-10-CM | POA: Diagnosis not present

## 2024-11-06 DIAGNOSIS — B351 Tinea unguium: Secondary | ICD-10-CM

## 2024-11-06 DIAGNOSIS — M79674 Pain in right toe(s): Secondary | ICD-10-CM | POA: Diagnosis not present

## 2024-11-06 NOTE — Progress Notes (Signed)
 This patient returns to my office for at risk foot care.  This patient requires this care by a professional since this patient will be at risk due to having diabetic neuropathy.  This patient is unable to cut nails himself since the patient cannot reach his nails.These nails are painful walking and wearing shoes.  This patient presents for at risk foot care today.  General Appearance  Alert, conversant and in no acute stress.  Vascular  Dorsalis pedis and posterior tibial  pulses are palpable  bilaterally.  Capillary return is within normal limits  bilaterally. Temperature is within normal limits  bilaterally.  Neurologic  Senn-Weinstein monofilament wire test within normal limits  bilaterally. Muscle power within normal limits bilaterally.  Nails Thick disfigured discolored nails with subungual debris  from hallux to fifth toes bilaterally. No evidence of bacterial infection or drainage bilaterally.  Orthopedic  No limitations of motion  feet .  No crepitus or effusions noted.  No bony pathology or digital deformities noted.  Skin  normotropic skin with no porokeratosis noted bilaterally.  No signs of infections or ulcers noted.     Onychomycosis  Pain in right toes  Pain in left toes  Consent was obtained for treatment procedures.   Mechanical debridement of nails 1-5  bilaterally performed with a nail nipper.  Filed with dremel without incident.    Return office visit   3 months                   Told patient to return for periodic foot care and evaluation due to potential at risk complications.   Helane Gunther DPM

## 2024-11-25 NOTE — Progress Notes (Addendum)
 RN left message with triage nurse at [facility], Dr. Nicholaus office, to follow up regarding scheduling fiducial marker's.   Buffy from [facility] returned call.   Several attempts have been made with no call back to schedule fiducial marker's.  RN left voicemail with patient as well for follow up to assist in getting patient with next steps for fiducial marker's.  Will continue to follow.

## 2024-12-02 NOTE — Progress Notes (Signed)
 Patient returned call and left voicemail updating that he has been recovering from influenza.   RN left additional voicemail to provide contact information to get fiducial marker's and spaceOAR get set up.

## 2024-12-05 NOTE — Progress Notes (Signed)
 Patient is now set up for fiducial marker's and spaceOAR gel placement with Dr. Nicholaus on 2/18.

## 2025-01-10 ENCOUNTER — Ambulatory Visit: Attending: Radiation Oncology | Admitting: Radiation Oncology

## 2025-02-04 ENCOUNTER — Ambulatory Visit: Admitting: Podiatry
